# Patient Record
Sex: Female | Born: 1965 | Race: Black or African American | Hispanic: No | Marital: Single | Smoking: Never smoker
Health system: Southern US, Community
[De-identification: ages and names within clinical notes are randomized; demographics above are authoritative.]

## PROBLEM LIST (undated history)

## (undated) DIAGNOSIS — I639 Cerebral infarction, unspecified: Secondary | ICD-10-CM

## (undated) DIAGNOSIS — T4145XA Adverse effect of unspecified anesthetic, initial encounter: Secondary | ICD-10-CM

## (undated) DIAGNOSIS — Z789 Other specified health status: Secondary | ICD-10-CM

## (undated) DIAGNOSIS — Z9861 Coronary angioplasty status: Secondary | ICD-10-CM

## (undated) DIAGNOSIS — E669 Obesity, unspecified: Secondary | ICD-10-CM

## (undated) DIAGNOSIS — D219 Benign neoplasm of connective and other soft tissue, unspecified: Secondary | ICD-10-CM

## (undated) DIAGNOSIS — T8859XA Other complications of anesthesia, initial encounter: Secondary | ICD-10-CM

## (undated) DIAGNOSIS — I10 Essential (primary) hypertension: Secondary | ICD-10-CM

## (undated) DIAGNOSIS — J45909 Unspecified asthma, uncomplicated: Secondary | ICD-10-CM

## (undated) DIAGNOSIS — E119 Type 2 diabetes mellitus without complications: Secondary | ICD-10-CM

## (undated) DIAGNOSIS — H9190 Unspecified hearing loss, unspecified ear: Secondary | ICD-10-CM

## (undated) DIAGNOSIS — E785 Hyperlipidemia, unspecified: Secondary | ICD-10-CM

## (undated) DIAGNOSIS — I2510 Atherosclerotic heart disease of native coronary artery without angina pectoris: Secondary | ICD-10-CM

## (undated) HISTORY — PX: CARDIAC CATHETERIZATION: SHX172

## (undated) HISTORY — PX: HERNIA REPAIR: SHX51

## (undated) HISTORY — DX: Benign neoplasm of connective and other soft tissue, unspecified: D21.9

## (undated) HISTORY — PX: DILATION AND CURETTAGE OF UTERUS: SHX78

---

## 2013-10-05 ENCOUNTER — Emergency Department (HOSPITAL_COMMUNITY): Payer: Medicare (Managed Care)

## 2013-10-05 ENCOUNTER — Emergency Department (HOSPITAL_COMMUNITY)
Admission: EM | Admit: 2013-10-05 | Discharge: 2013-10-05 | Disposition: A | Discharge status: Home / Self Care | Payer: Medicare (Managed Care) | Attending: Emergency Medicine | Admitting: Emergency Medicine

## 2013-10-05 ENCOUNTER — Encounter (HOSPITAL_COMMUNITY): Payer: Self-pay | Admitting: Emergency Medicine

## 2013-10-05 DIAGNOSIS — R05 Cough: Secondary | ICD-10-CM

## 2013-10-05 DIAGNOSIS — Z91040 Latex allergy status: Secondary | ICD-10-CM | POA: Insufficient documentation

## 2013-10-05 DIAGNOSIS — E119 Type 2 diabetes mellitus without complications: Secondary | ICD-10-CM | POA: Insufficient documentation

## 2013-10-05 DIAGNOSIS — J45901 Unspecified asthma with (acute) exacerbation: Secondary | ICD-10-CM | POA: Insufficient documentation

## 2013-10-05 HISTORY — DX: Obesity, unspecified: E66.9

## 2013-10-05 HISTORY — DX: Type 2 diabetes mellitus without complications: E11.9

## 2013-10-05 HISTORY — DX: Unspecified asthma, uncomplicated: J45.909

## 2013-10-05 MED ORDER — ALBUTEROL SULFATE (5 MG/ML) 0.5% IN NEBU
5.0000 mg | INHALATION_SOLUTION | Freq: Once | RESPIRATORY_TRACT | Status: AC
  Administered 2013-10-05: 5 mg via RESPIRATORY_TRACT
  Filled 2013-10-05: qty 1

## 2013-10-05 MED ORDER — GI COCKTAIL ~~LOC~~
30.0000 mL | Freq: Once | ORAL | Status: AC
  Administered 2013-10-05: 30 mL via ORAL
  Filled 2013-10-05: qty 30

## 2013-10-05 MED ORDER — IPRATROPIUM BROMIDE 0.02 % IN SOLN
0.5000 mg | Freq: Once | RESPIRATORY_TRACT | Status: AC
  Administered 2013-10-05: 0.5 mg via RESPIRATORY_TRACT
  Filled 2013-10-05: qty 2.5

## 2013-10-05 NOTE — ED Notes (Signed)
PT ambulated with baseline gait; VSS; A&Ox3; no signs of distress; respirations even and unlabored; skin warm and dry; no questions upon discharge.  

## 2013-10-05 NOTE — ED Notes (Signed)
Sign language interpreter paged

## 2013-10-05 NOTE — ED Notes (Signed)
Pt not in waiting room when called; nurse first to look for pt when she returns.

## 2013-10-05 NOTE — ED Notes (Signed)
Pt has hx of reflux; reports she spit up, threw up sticky mucous last night.

## 2013-10-05 NOTE — ED Notes (Signed)
Pt reports she feels "alot better; coughed a lot of it out". PA aware.

## 2013-10-05 NOTE — ED Provider Notes (Signed)
CSN: [id number]     Arrival date & time 10/05/13  1547 History  This chart was scribed for non-physician practitioner, Sharilyn Sites, PA-C working with Carmen Moore. Oletta Lamas, MD by Greggory Stallion, ED scribe. This patient was seen in room TR11C/TR11C and the patient's care was started at 5:41 PM.   Chief Complaint  Patient presents with  . Asthma   The history is provided by the patient. A language interpreter was used (Sign Language Interpreter - Eliseo Squires of [employer]).   HPI Comments: Carmen Moore is a 47 y.o. female with history of of asthma who presents to the Emergency Department complaining of and intermittent productive cough and mild shortness of breath onset 3 days ago. Cough is productive of clear sputum. Patient has hx of asthma states that she has used her inhalers with no relief of symptoms. Patient further reports that she had a couple of episodes of emesis of clear sputum last night that is attributed to acid reflux. She denies chest pain, palpitations, or any other symptoms at this time. Her other medical history includes DM.   Past Medical History  Diagnosis Date  . Asthma   . Obesity   . Diabetes mellitus without complication    History reviewed. No pertinent past surgical history. History reviewed. No pertinent family history. History  Substance Use Topics  . Smoking status: Never Smoker   . Smokeless tobacco: Not on file  . Alcohol Use: No   OB History   Grav Para Term Preterm Abortions TAB SAB Ect Mult Living                 Review of Systems  Respiratory: Positive for cough and shortness of breath.   Cardiovascular: Negative for chest pain.  All other systems reviewed and are negative.    Allergies  Latex  Home Medications  No current outpatient prescriptions on file.  BP 170/110  Pulse 94  Temp(Src) 98.9 F (37.2 C) (Oral)  Resp 18  SpO2 95%  Physical Exam  Nursing note and vitals  reviewed. Constitutional: She is oriented to person, place, and time. She appears well-developed and well-nourished. No distress.  Morbidly obese.   HENT:  Head: Normocephalic and atraumatic.  Mouth/Throat: Uvula is midline, oropharynx is clear and moist and mucous membranes are normal. No trismus in the jaw. No oropharyngeal exudate, posterior oropharyngeal edema, posterior oropharyngeal erythema or tonsillar abscesses.  Tonsils normal in appearance bilaterally without exudate, uvula midline, no peritonsillar abscess, healing secretions properly, no difficulty swallowing  Eyes: Conjunctivae and EOM are normal. Pupils are equal, round, and reactive to light.  Neck: Normal range of motion. Neck supple.  Cardiovascular: Normal rate, regular rhythm and normal heart sounds.   Pulmonary/Chest: Effort normal and breath sounds normal. No respiratory distress. She has no wheezes. She has no rhonchi. She has no rales.  Lungs CTAB  Musculoskeletal: Normal range of motion.  Neurological: She is alert and oriented to person, place, and time.  Skin: Skin is warm and dry. She is not diaphoretic.  Psychiatric: She has a normal mood and affect.    ED Course  Procedures (including critical care time)  DIAGNOSTIC STUDIES: Oxygen Saturation is 95% on RA, adequate by my interpretation.    COORDINATION OF CARE: 5:43 PM- CXR negative for bronchitis or pneumonia. Will give nebulizer treatments and GI cocktail. Discussed treatment plan with pt at bedside and pt agreed to plan.  6:15 PM- Patient is improved after  breathing treatment and is ready to go home.  Labs Review Labs Reviewed - No data to display  Imaging Review Dg Chest 2 View (if Patient Has Fever And/or Copd)  10/05/2013   CLINICAL DATA:  Shortness of breath, cough, and vomiting.  EXAM: CHEST  2 VIEW  COMPARISON:  None.  FINDINGS: The heart size and mediastinal contours are within normal limits. Both lungs are clear. The visualized skeletal  structures are unremarkable.  IMPRESSION: Normal exam.   Electronically Signed   By: Geanie Cooley M.D.   On: 10/05/2013 17:15    EKG Interpretation   None       MDM   1. Asthma exacerbation, mild   2. Cough    CXR clear.  Pt given albuterol Atrovent nebulizer treatment with good improvement of symptoms. Lungs remain clear to auscultation. She will [facility]up [facility] if problems occur. Discussed plan with patient via, and which interpreter, she now is understanding and agreed. Return precautions advised.  I personally performed the services described in this documentation, which was scribed in my presence. The recorded information has been reviewed and is accurate.  Carmen Hatchet, PA-C 10/05/13 Ernestina Carmen

## 2013-10-05 NOTE — ED Notes (Signed)
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 Pt does read lips, reports having asthma, no relief with inhalers and having productive cough. Airway is intact at triage.

## 2013-10-07 NOTE — ED Provider Notes (Signed)
Medical screening examination/treatment/procedure(s) were performed by non-physician practitioner and as supervising physician I was immediately available for consultation/collaboration.  Delshon Blanchfield Y. Akira Adelsberger, MD 10/07/13 1643 

## 2013-10-10 ENCOUNTER — Inpatient Hospital Stay (HOSPITAL_COMMUNITY)
Admission: EM | Admit: 2013-10-10 | Discharge: 2013-10-11 | DRG: 287 | Disposition: A | Discharge status: Home / Self Care | Payer: Medicare (Managed Care) | Attending: Internal Medicine | Admitting: Internal Medicine

## 2013-10-10 ENCOUNTER — Emergency Department (HOSPITAL_COMMUNITY): Payer: Medicare (Managed Care)

## 2013-10-10 ENCOUNTER — Encounter (HOSPITAL_COMMUNITY): Payer: Self-pay | Admitting: Emergency Medicine

## 2013-10-10 DIAGNOSIS — R0789 Other chest pain: Principal | ICD-10-CM | POA: Diagnosis present

## 2013-10-10 DIAGNOSIS — J45909 Unspecified asthma, uncomplicated: Secondary | ICD-10-CM | POA: Diagnosis present

## 2013-10-10 DIAGNOSIS — Z79899 Other long term (current) drug therapy: Secondary | ICD-10-CM

## 2013-10-10 DIAGNOSIS — I359 Nonrheumatic aortic valve disorder, unspecified: Secondary | ICD-10-CM

## 2013-10-10 DIAGNOSIS — I2510 Atherosclerotic heart disease of native coronary artery without angina pectoris: Secondary | ICD-10-CM | POA: Diagnosis present

## 2013-10-10 DIAGNOSIS — E785 Hyperlipidemia, unspecified: Secondary | ICD-10-CM | POA: Diagnosis present

## 2013-10-10 DIAGNOSIS — Z7982 Long term (current) use of aspirin: Secondary | ICD-10-CM

## 2013-10-10 DIAGNOSIS — Z9861 Coronary angioplasty status: Secondary | ICD-10-CM

## 2013-10-10 DIAGNOSIS — I10 Essential (primary) hypertension: Secondary | ICD-10-CM | POA: Diagnosis present

## 2013-10-10 DIAGNOSIS — Z6841 Body Mass Index (BMI) 40.0 and over, adult: Secondary | ICD-10-CM

## 2013-10-10 DIAGNOSIS — R079 Chest pain, unspecified: Secondary | ICD-10-CM | POA: Diagnosis present

## 2013-10-10 DIAGNOSIS — Z23 Encounter for immunization: Secondary | ICD-10-CM

## 2013-10-10 DIAGNOSIS — E669 Obesity, unspecified: Secondary | ICD-10-CM | POA: Diagnosis present

## 2013-10-10 DIAGNOSIS — E119 Type 2 diabetes mellitus without complications: Secondary | ICD-10-CM | POA: Diagnosis present

## 2013-10-10 DIAGNOSIS — H913 Deaf nonspeaking, not elsewhere classified: Secondary | ICD-10-CM | POA: Diagnosis present

## 2013-10-10 HISTORY — DX: Hyperlipidemia, unspecified: E78.5

## 2013-10-10 HISTORY — DX: Essential (primary) hypertension: I10

## 2013-10-10 HISTORY — DX: Unspecified hearing loss, unspecified ear: H91.90

## 2013-10-10 HISTORY — DX: Atherosclerotic heart disease of native coronary artery without angina pectoris: Z98.61

## 2013-10-10 HISTORY — DX: Coronary angioplasty status: Z98.61

## 2013-10-10 HISTORY — DX: Atherosclerotic heart disease of native coronary artery without angina pectoris: I25.10

## 2013-10-10 LAB — URINE MICROSCOPIC-ADD ON

## 2013-10-10 LAB — PROTIME-INR: Prothrombin Time: 11.9 seconds (ref 11.6–15.2)

## 2013-10-10 LAB — CBC
HCT: 37.9 % (ref 36.0–46.0)
HCT: 37.4 % (ref 36.0–46.0)
MCH: 26.6 pg (ref 26.0–34.0)
MCHC: 32.2 g/dL (ref 30.0–36.0)
MCHC: 31.8 g/dL (ref 30.0–36.0)
MCV: 82.8 fL (ref 78.0–100.0)
MCV: 82.7 fL (ref 78.0–100.0)
Platelets: 236 10*3/uL (ref 150–400)
RBC: 4.58 MIL/uL (ref 3.87–5.11)
RDW: 14.8 % (ref 11.5–15.5)
RDW: 14.5 % (ref 11.5–15.5)

## 2013-10-10 LAB — URINALYSIS, ROUTINE W REFLEX MICROSCOPIC
Bilirubin Urine: NEGATIVE
Glucose, UA: NEGATIVE mg/dL
Hgb urine dipstick: NEGATIVE
Ketones, ur: NEGATIVE mg/dL
Leukocytes, UA: NEGATIVE
Nitrite: NEGATIVE
Protein, ur: 30 mg/dL — AB
Specific Gravity, Urine: 1.024 (ref 1.005–1.030)
Urobilinogen, UA: 0.2 mg/dL (ref 0.0–1.0)
pH: 7 (ref 5.0–8.0)

## 2013-10-10 LAB — MRSA PCR SCREENING: MRSA by PCR: NEGATIVE

## 2013-10-10 LAB — COMPREHENSIVE METABOLIC PANEL
AST: 16 U/L (ref 0–37)
Albumin: 3.6 g/dL (ref 3.5–5.2)
BUN: 9 mg/dL (ref 6–23)
Creatinine, Ser: 0.67 mg/dL (ref 0.50–1.10)
Sodium: 139 mEq/L (ref 135–145)
Total Bilirubin: 0.3 mg/dL (ref 0.3–1.2)
Total Protein: 7.7 g/dL (ref 6.0–8.3)

## 2013-10-10 LAB — GLUCOSE, CAPILLARY
Glucose-Capillary: 82 mg/dL (ref 70–99)
Glucose-Capillary: 109 mg/dL — ABNORMAL HIGH (ref 70–99)

## 2013-10-10 LAB — TROPONIN I
Troponin I: <0.3 ng/mL (ref <0.30)
Troponin I: <0.3 ng/mL (ref <0.30)
Troponin I: <0.3 ng/mL (ref <0.30)

## 2013-10-10 LAB — PRO B NATRIURETIC PEPTIDE: Pro B Natriuretic peptide (BNP): 44.7 pg/mL (ref 0–125)

## 2013-10-10 LAB — MAGNESIUM: Magnesium: 2 mg/dL (ref 1.5–2.5)

## 2013-10-10 MED ORDER — MORPHINE SULFATE 4 MG/ML IJ SOLN
4.0000 mg | Freq: Once | INTRAMUSCULAR | Status: AC
  Administered 2013-10-10: 4 mg via INTRAVENOUS
  Filled 2013-10-10: qty 1

## 2013-10-10 MED ORDER — SODIUM CHLORIDE 0.9 % IV SOLN
INTRAVENOUS | Status: DC
  Administered 2013-10-11: 06:00:00 via INTRAVENOUS

## 2013-10-10 MED ORDER — ASPIRIN 81 MG PO CHEW: 324.0000 mg | CHEWABLE_TABLET | Freq: Once | ORAL | Status: DC

## 2013-10-10 MED ORDER — MORPHINE SULFATE 2 MG/ML IJ SOLN
2.0000 mg | Freq: Once | INTRAMUSCULAR | Status: AC
  Administered 2013-10-10: 2 mg via INTRAVENOUS
  Filled 2013-10-10: qty 1

## 2013-10-10 MED ORDER — MAGNESIUM CITRATE PO SOLN: 1.0000 | Freq: Once | ORAL | Status: AC | PRN

## 2013-10-10 MED ORDER — HEPARIN BOLUS VIA INFUSION
4000.0000 [IU] | Freq: Once | INTRAVENOUS | Status: AC
  Administered 2013-10-10: 4000 [IU] via INTRAVENOUS
  Filled 2013-10-10: qty 4000

## 2013-10-10 MED ORDER — SODIUM CHLORIDE 0.9 % IJ SOLN: 3.0000 mL | Freq: Two times a day (BID) | INTRAMUSCULAR | Status: DC

## 2013-10-10 MED ORDER — ALBUTEROL SULFATE HFA 108 (90 BASE) MCG/ACT IN AERS
1.0000 | INHALATION_SPRAY | Freq: Four times a day (QID) | RESPIRATORY_TRACT | Status: DC | PRN
  Filled 2013-10-10: qty 6.7

## 2013-10-10 MED ORDER — ACETAMINOPHEN 325 MG PO TABS: 650.0000 mg | ORAL_TABLET | ORAL | Status: DC | PRN

## 2013-10-10 MED ORDER — ASPIRIN 81 MG PO CHEW: 81.0000 mg | CHEWABLE_TABLET | Freq: Every day | ORAL | Status: DC

## 2013-10-10 MED ORDER — DIAZEPAM 2 MG PO TABS
2.0000 mg | ORAL_TABLET | ORAL | Status: AC
  Administered 2013-10-11: 2 mg via ORAL
  Filled 2013-10-10: qty 1

## 2013-10-10 MED ORDER — INSULIN ASPART 100 UNIT/ML ~~LOC~~ SOLN: 0.0000 [IU] | Freq: Three times a day (TID) | SUBCUTANEOUS | Status: DC

## 2013-10-10 MED ORDER — ACETAMINOPHEN 325 MG PO TABS: 650.0000 mg | ORAL_TABLET | Freq: Four times a day (QID) | ORAL | Status: DC | PRN

## 2013-10-10 MED ORDER — MORPHINE SULFATE 2 MG/ML IJ SOLN
2.0000 mg | INTRAMUSCULAR | Status: DC | PRN
  Administered 2013-10-10 (×2): 2 mg via INTRAVENOUS
  Filled 2013-10-10 (×2): qty 1

## 2013-10-10 MED ORDER — IPRATROPIUM BROMIDE 0.02 % IN SOLN: 0.5000 mg | RESPIRATORY_TRACT | Status: DC | PRN

## 2013-10-10 MED ORDER — SODIUM CHLORIDE 0.9 % IJ SOLN: 3.0000 mL | INTRAMUSCULAR | Status: DC | PRN

## 2013-10-10 MED ORDER — HEPARIN SODIUM (PORCINE) 5000 UNIT/ML IJ SOLN: 5000.0000 [IU] | Freq: Three times a day (TID) | INTRAMUSCULAR | Status: DC

## 2013-10-10 MED ORDER — NITROGLYCERIN 0.4 MG SL SUBL
0.4000 mg | SUBLINGUAL_TABLET | SUBLINGUAL | Status: DC | PRN
  Filled 2013-10-10: qty 25

## 2013-10-10 MED ORDER — ASPIRIN EC 325 MG PO TBEC
325.0000 mg | DELAYED_RELEASE_TABLET | Freq: Every day | ORAL | Status: DC
  Administered 2013-10-11: 325 mg via ORAL
  Filled 2013-10-10 (×2): qty 1

## 2013-10-10 MED ORDER — SODIUM CHLORIDE 0.9 % IV SOLN
INTRAVENOUS | Status: DC
  Administered 2013-10-10: 10:00:00 via INTRAVENOUS

## 2013-10-10 MED ORDER — MORPHINE SULFATE 4 MG/ML IJ SOLN
6.0000 mg | Freq: Once | INTRAMUSCULAR | Status: AC
  Administered 2013-10-10: 6 mg via INTRAVENOUS
  Filled 2013-10-10: qty 2

## 2013-10-10 MED ORDER — POTASSIUM CHLORIDE CRYS ER 20 MEQ PO TBCR
20.0000 meq | EXTENDED_RELEASE_TABLET | Freq: Every day | ORAL | Status: DC
  Administered 2013-10-11: 20 meq via ORAL
  Filled 2013-10-10: qty 1

## 2013-10-10 MED ORDER — DIAZEPAM 2 MG PO TABS: 2.0000 mg | ORAL_TABLET | ORAL | Status: DC

## 2013-10-10 MED ORDER — ACETAMINOPHEN 650 MG RE SUPP: 650.0000 mg | Freq: Four times a day (QID) | RECTAL | Status: DC | PRN

## 2013-10-10 MED ORDER — HEPARIN (PORCINE) IN NACL 100-0.45 UNIT/ML-% IJ SOLN
2100.0000 [IU]/h | INTRAMUSCULAR | Status: DC
  Administered 2013-10-10: 1600 [IU]/h via INTRAVENOUS
  Administered 2013-10-11 (×2): 2100 [IU]/h via INTRAVENOUS
  Filled 2013-10-10 (×5): qty 250

## 2013-10-10 MED ORDER — METOPROLOL TARTRATE 50 MG PO TABS
50.0000 mg | ORAL_TABLET | Freq: Every day | ORAL | Status: DC
  Administered 2013-10-10 – 2013-10-11 (×2): 50 mg via ORAL
  Filled 2013-10-10 (×2): qty 1

## 2013-10-10 MED ORDER — ONDANSETRON HCL 4 MG/2ML IJ SOLN
4.0000 mg | Freq: Once | INTRAMUSCULAR | Status: AC
  Administered 2013-10-10: 4 mg via INTRAVENOUS
  Filled 2013-10-10: qty 2

## 2013-10-10 MED ORDER — SIMVASTATIN 40 MG PO TABS
40.0000 mg | ORAL_TABLET | Freq: Every day | ORAL | Status: DC
  Administered 2013-10-10: 40 mg via ORAL
  Filled 2013-10-10 (×2): qty 1

## 2013-10-10 MED ORDER — ONDANSETRON HCL 4 MG PO TABS: 4.0000 mg | ORAL_TABLET | Freq: Four times a day (QID) | ORAL | Status: DC | PRN

## 2013-10-10 MED ORDER — SORBITOL 70 % SOLN
30.0000 mL | Freq: Every day | Status: DC | PRN
  Filled 2013-10-10: qty 30

## 2013-10-10 MED ORDER — FUROSEMIDE 40 MG PO TABS
40.0000 mg | ORAL_TABLET | Freq: Every day | ORAL | Status: DC
  Administered 2013-10-10: 40 mg via ORAL
  Filled 2013-10-10 (×2): qty 1

## 2013-10-10 MED ORDER — HEPARIN SODIUM (PORCINE) 5000 UNIT/ML IJ SOLN
5000.0000 [IU] | Freq: Three times a day (TID) | INTRAMUSCULAR | Status: DC
  Filled 2013-10-10 (×3): qty 1

## 2013-10-10 MED ORDER — ALBUTEROL SULFATE (5 MG/ML) 0.5% IN NEBU: 2.5000 mg | INHALATION_SOLUTION | RESPIRATORY_TRACT | Status: DC | PRN

## 2013-10-10 MED ORDER — ONDANSETRON HCL 4 MG/2ML IJ SOLN
4.0000 mg | Freq: Four times a day (QID) | INTRAMUSCULAR | Status: DC | PRN
  Administered 2013-10-10: 4 mg via INTRAVENOUS
  Filled 2013-10-10: qty 2

## 2013-10-10 MED ORDER — ONDANSETRON HCL 4 MG/2ML IJ SOLN: 4.0000 mg | Freq: Four times a day (QID) | INTRAMUSCULAR | Status: DC | PRN

## 2013-10-10 MED ORDER — LISINOPRIL 10 MG PO TABS
10.0000 mg | ORAL_TABLET | Freq: Every day | ORAL | Status: DC
  Administered 2013-10-10: 10 mg via ORAL
  Filled 2013-10-10 (×2): qty 1

## 2013-10-10 MED ORDER — OXYCODONE HCL 5 MG PO TABS
5.0000 mg | ORAL_TABLET | ORAL | Status: DC | PRN
  Administered 2013-10-10: 5 mg via ORAL
  Filled 2013-10-10: qty 1

## 2013-10-10 MED ORDER — SODIUM CHLORIDE 0.9 % IV SOLN: 250.0000 mL | INTRAVENOUS | Status: DC | PRN

## 2013-10-10 MED ORDER — ASPIRIN 81 MG PO CHEW
81.0000 mg | CHEWABLE_TABLET | ORAL | Status: AC
  Administered 2013-10-11: 81 mg via ORAL
  Filled 2013-10-10: qty 1

## 2013-10-10 MED ORDER — ALUM & MAG HYDROXIDE-SIMETH 200-200-20 MG/5ML PO SUSP: 30.0000 mL | Freq: Four times a day (QID) | ORAL | Status: DC | PRN

## 2013-10-10 NOTE — H&P (Signed)
[facility] History and Physical  Miamor Ayler JYN:[id number] DOB: 1966-06-06 DOA: 10/10/2013  Referring physician: Charlestine Night, PA PCP: No PCP Per Patient   Chief Complaint:   HPI: Scherry Laverne is a 47 y.o. female  Who was visiting from [place], with history of hypertension, hyperlipidemia, asthma, diabetes, probable coronary artery disease about 8 years ago as patient describes a cardiac catheterization with cleaning of vessels who presents to the ED with sudden onset midsternal to left substernal chest pain radiating to the left upper extremity described as a squeezing sensation with left upper extremity numbness. Patient stated that pain had been constant and currently at 10 out of a 10. Patient endorses some shortness of breath, palpitations, nausea. Patient denies any diaphoresis, no vomiting, no fever, no chills, no orthopnea, no paroxysmal nocturnal dyspnea. Patient states she's lost some weight. Patient also endorses some lower extremity edema over a week. Patient also with a cough of whitish sputum over the past 2 weeks. Patient was brought to the ED by EMS and was given some aspirin. Patient states she is intolerant to nitroglycerin. In the ED comprehensive metabolic profile alk phosphatase of 121 a glucose of 142 otherwise was within normal limits. Pro BNP was 44.7. CBC was unremarkable.EKG had T-wave inversions in leads 2,3, aVF, V3 through V6. We were called to admit the patient for further evaluation and management.   Review of Systems:  Constitutional:  No weight loss, night sweats, Fevers, chills, fatigue.  HEENT:  No headaches, Difficulty swallowing,Tooth/dental problems,Sore throat,  No sneezing, itching, ear ache, nasal congestion, post nasal drip,  Cardio-vascular:  No chest pain, Orthopnea, PND, swelling in lower extremities, anasarca, dizziness, palpitations  GI:  No heartburn, indigestion, abdominal pain, nausea, vomiting, diarrhea, change in bowel habits,  loss of appetite  Resp:  No shortness of breath with exertion or at rest. No excess mucus, no productive cough, No non-productive cough, No coughing up of blood.No change in color of mucus.No wheezing.No chest wall deformity  Skin:  no rash or lesions.  GU:  no dysuria, change in color of urine, no urgency or frequency. No flank pain.  Musculoskeletal:  No joint pain or swelling. No decreased range of motion. No back pain.  Psych:  No change in mood or affect. No depression or anxiety. No memory loss.   Past Medical History  Diagnosis Date  . Asthma   . Obesity   . Diabetes mellitus without complication   . Hypertension   . HTN (hypertension) 10/10/2013  . CAD S/P percutaneous coronary angioplasty: PROBABLE PER PT DESCRIPTION OF CATH 2006 10/10/2013  . Hyperlipidemia    Past Surgical History  Procedure Laterality Date  . Hernia repair     Social History:  reports that she has never smoked. She has never used smokeless tobacco. She reports that she does not drink alcohol or use illicit drugs.  Allergies  Allergen Reactions  . Nitroglycerin Itching  . Latex Rash    History reviewed. No pertinent family history.   Prior to Admission medications   Medication Sig Start Date End Date Taking? Authorizing Provider  albuterol (PROVENTIL HFA;VENTOLIN HFA) 108 (90 BASE) MCG/ACT inhaler Inhale 1-2 puffs into the lungs every 6 (six) hours as needed for wheezing or shortness of breath.   Yes Historical Provider, MD  aspirin 81 MG tablet Take 81 mg by mouth daily.   Yes Historical Provider, MD  furosemide (LASIX) 40 MG tablet Take 40 mg by mouth daily.   Yes Historical Provider, MD  lisinopril (PRINIVIL,ZESTRIL) 10 MG tablet Take 10 mg by mouth daily.   Yes Historical Provider, MD  metoprolol (LOPRESSOR) 50 MG tablet Take 50 mg by mouth daily.   Yes Historical Provider, MD  potassium chloride SA (K-DUR,KLOR-CON) 20 MEQ tablet Take 20 mEq by mouth daily.   Yes Historical Provider, MD   simvastatin (ZOCOR) 40 MG tablet Take 40 mg by mouth daily.   Yes Historical Provider, MD   Physical Exam: Filed Vitals:   10/10/13 1300  BP: 106/52  Pulse: 80  Temp:   Resp: 9    BP 106/52  Pulse 80  Temp(Src) 98.3 F (36.8 C) (Oral)  Resp 9  SpO2 96%  General:  Laying in bed with c/o CP. Eyes: PERRL, normal lids, irises & conjunctiva ENT: grossly normal hearing, lips & tongue Neck: no LAD, masses or thyromegaly Cardiovascular: RRR, no m/r/g. No LE edema. Telemetry: SR, no arrhythmias  Respiratory: CTA bilaterally, no w/r/r. Normal respiratory effort. Abdomen: soft, ntnd, positive bowel sounds. Skin: no rash or induration seen on limited exam Musculoskeletal: grossly normal tone BUE/BLE Psychiatric: grossly normal mood and affect, speech fluent and appropriate Neurologic: Alert and oriented x 3. CN 2-12 grossly intact. No focal deficits.          Labs on Admission:  Basic Metabolic Panel:  Recent Labs Lab 10/10/13 0920  NA 139  K 3.9  CL 100  CO2 26  GLUCOSE 142*  BUN 9  CREATININE 0.67  CALCIUM 9.3   Liver Function Tests:  Recent Labs Lab 10/10/13 0920  AST 16  ALT 12  ALKPHOS 121*  BILITOT 0.3  PROT 7.7  ALBUMIN 3.6   No results found for this basename: LIPASE, AMYLASE,  in the last 168 hours No results found for this basename: AMMONIA,  in the last 168 hours CBC:  Recent Labs Lab 10/10/13 0920  WBC 10.3  HGB 12.2  HCT 37.9  MCV 82.8  PLT 236   Cardiac Enzymes:  Recent Labs Lab 10/10/13 0920  TROPONINI <0.30    BNP (last 3 results)  Recent Labs  10/10/13 0920  PROBNP 44.7   CBG: No results found for this basename: GLUCAP,  in the last 168 hours  Radiological Exams on Admission: Dg Chest 2 View  10/10/2013   CLINICAL DATA:  Chest pain  EXAM: CHEST  2 VIEW  COMPARISON:  October 05, 2013  FINDINGS: There is no edema or consolidation. Heart is enlarged with normal pulmonary vascularity. No adenopathy. There is  degenerative change in the thoracic spine.  IMPRESSION: Cardiomegaly.  No edema or consolidation.   Electronically Signed   By: Bretta Bang M.D.   On: 10/10/2013 09:01    EKG: Independently reviewed. T-wave inversion in leads 2,3, aVF, V3 through V6.  Assessment/Plan Principal Problem:   Chest pain Active Problems:   HTN (hypertension)   Hyperlipidemia   Diabetes mellitus   Asthma   CAD S/P percutaneous coronary angioplasty: PROBABLE PER PT DESCRIPTION OF CATH 2006  #1 chest pain Patient presented with typical symptoms of chest pain with left upper extremity radiation. Patient with hypertension, diabetes, hyperlipidemia, probable prior coronary artery disease per patient's description of her cardiac catheterization. Will admit the patient to step down unit as patient is currently having ongoing chest pain. Patient is intolerant to nitroglycerin. Thank you for lipid panel. Will cycle cardiac enzymes every 6 hours x3. Check a 2-D echo. Will place on oxygen, morphine,continue home regimen of her lisinopril, Lopressor, Lasix, simvastatin, aspirin. Consult  with cardiology for further evaluation and management.  #2 hypertension Resume home regimen of lisinopril, Lopressor, Lasix.  #3. Hyperlipidemia Check FLP. Continue statin  #4 DM Check Hgb A1C. SSI.  #5. Asthma Nebs as needed.  #6. Prophylaxis Lovenox for DVT prophylaxis.  Code Status: full Family Communication: updated patient at bedside no family present. Disposition Plan: Admit to step down unit  Time spent: 45 MINS  [facility] MD [facility] Pager [phone]

## 2013-10-10 NOTE — Progress Notes (Signed)
  Echocardiogram 2D Echocardiogram has been performed.  Carmen Moore [name] 10/10/2013, 6:49 PM

## 2013-10-10 NOTE — ED Notes (Signed)
Pt presents via [facility] with chest pain that start this morning at 0700am.  The patient is deaf and unable to provide details until interpreter at bedside.  Per EMS, the chest pain was substernal with left arm pain.  Pt took 324mg  at home prior to EMS arrival.  Pt denied N/V/SOB.

## 2013-10-10 NOTE — ED Notes (Signed)
Vernona Rieger PA with Cardiology at bedside.

## 2013-10-10 NOTE — Progress Notes (Signed)
ANTICOAGULATION CONSULT NOTE - Initial Consult  Pharmacy Consult for Heparin Indication: chest pain/ACS  Allergies  Allergen Reactions  . Tomato   . Beef-Derived Products Diarrhea  . Nitroglycerin Itching  . Latex Rash    Patient Measurements: Height: 5\' 8"  (172.7 cm) Weight: 329 lb (149.233 kg) IBW/kg (Calculated) : 63.9 Heparin Dosing Weight: 103 kg  Vital Signs: Temp: 98.7 F (37.1 C) (12/23 1600) Temp src: Oral (12/23 1600) BP: 119/84 mmHg (12/23 1600) Pulse Rate: 87 (12/23 1500)  Labs:  Recent Labs  10/10/13 0920 10/10/13 1505 10/10/13 1717  HGB 12.2  --  11.9*  HCT 37.9  --  37.4  PLT 236  --  226  APTT  --   --  30  LABPROT  --   --  11.9  INR  --   --  0.89  CREATININE 0.67  --   --   TROPONINI <0.30 <0.30  --     Estimated Creatinine Clearance: 135.9 ml/min (by C-G formula based on Cr of 0.67).   Medical History: Past Medical History  Diagnosis Date  . Asthma   . Obesity   . Diabetes mellitus without complication   . Hypertension   . HTN (hypertension) 10/10/2013  . CAD S/P percutaneous coronary angioplasty: PROBABLE PER PT DESCRIPTION OF CATH 2006 10/10/2013  . Hyperlipidemia   . Deaf       Assessment: 47yof admitted with CP.  1st Tp negative, CBC stable, renal function stable, BP 120/80, HR 80s.   Goal of Therapy:  Heparin level 0.3-0.7 units/ml Monitor platelets by anticoagulation protocol: Yes   Plan:  Heparin bolus 4000 uts IV x1 Heparin drip 1600 uts/hr Heparin level 6hr after drip started Daily CBC, HL  Leota Sauers Pharm.D. CPP, BCPS Clinical Pharmacist [phone] 10/10/2013 6:10 PM

## 2013-10-10 NOTE — ED Provider Notes (Signed)
CSN: [id number]     Arrival date & time 10/10/13  4098 History   First MD Initiated Contact with Patient 10/10/13 [phone]     Chief Complaint  Patient presents with  . Chest Pain   (Consider location/radiation/quality/duration/timing/severity/associated sxs/prior Treatment) HPI Patient presents emergency department with chest pain, that started acutely at 7 AM the patient, states the pain radiated to her left arm.  Patient has an extensive cardiac history, along with diabetes, and hypertension.  Patient is here from [place].  She is also a deaf mute patient states, that she did not take any medications prior to arrival for her symptoms.  This was given nitroglycerin by EMS, along with aspirin.  Patient denies fever, cough, nausea, vomiting, diarrhea, abdominal pain, dizziness, blurred vision, headache, back pain, or syncope.  The patient, states, that she was hospitalized 2 months ago, similar symptoms in [place].  This history is obtained through the interpreter Past Medical History  Diagnosis Date  . Asthma   . Obesity   . Diabetes mellitus without complication   . Hypertension    Past Surgical History  Procedure Laterality Date  . Hernia repair     No family history on file. History  Substance Use Topics  . Smoking status: Never Smoker   . Smokeless tobacco: Never Used  . Alcohol Use: No   OB History   Grav Para Term Preterm Abortions TAB SAB Ect Mult Living                 Review of Systems All other systems negative except as documented in the HPI. All pertinent positives and negatives as reviewed in the HPI. Allergies  Latex  Home Medications   Current Outpatient Rx  Name  Route  Sig  Dispense  Refill  . albuterol (PROVENTIL HFA;VENTOLIN HFA) 108 (90 BASE) MCG/ACT inhaler   Inhalation   Inhale 1-2 puffs into the lungs every 6 (six) hours as needed for wheezing or shortness of breath.         Marland Kitchen aspirin 81 MG tablet   Oral   Take 81 mg by mouth daily.         .  furosemide (LASIX) 40 MG tablet   Oral   Take 40 mg by mouth daily.         Marland Kitchen lisinopril (PRINIVIL,ZESTRIL) 10 MG tablet   Oral   Take 10 mg by mouth daily.         . metoprolol (LOPRESSOR) 50 MG tablet   Oral   Take 50 mg by mouth daily.         . potassium chloride SA (K-DUR,KLOR-CON) 20 MEQ tablet   Oral   Take 20 mEq by mouth daily.         . simvastatin (ZOCOR) 40 MG tablet   Oral   Take 40 mg by mouth daily.          BP 150/77  Pulse 70  Temp(Src) 98.3 F (36.8 C) (Oral)  Resp 19  SpO2 100% Physical Exam  Nursing note and vitals reviewed. Constitutional: She is oriented to person, place, and time. She appears well-developed and well-nourished. No distress.  HENT:  Head: Normocephalic and atraumatic.  Mouth/Throat: Oropharynx is clear and moist.  Eyes: Pupils are equal, round, and reactive to light.  Neck: Normal range of motion. Neck supple.  Cardiovascular: Normal rate, regular rhythm and normal heart sounds.  Exam reveals no gallop and no friction rub.   No murmur heard. Pulmonary/Chest: Effort  normal and breath sounds normal. No respiratory distress.  Abdominal: Soft. Bowel sounds are normal. She exhibits no distension. There is no tenderness.  Neurological: She is alert and oriented to person, place, and time. She exhibits normal muscle tone. Coordination normal.  Skin: Skin is warm and dry. No rash noted. No erythema.    ED Course  Procedures (including critical care time) Labs Review Labs Reviewed  COMPREHENSIVE METABOLIC PANEL - Abnormal; Notable for the following:    Glucose, Bld 142 (*)    Alkaline Phosphatase 121 (*)    All other components within normal limits  URINALYSIS, ROUTINE W REFLEX MICROSCOPIC - Abnormal; Notable for the following:    Protein, ur 30 (*)    All other components within normal limits  URINE MICROSCOPIC-ADD ON - Abnormal; Notable for the following:    Squamous Epithelial / LPF FEW (*)    All other components  within normal limits  CBC  TROPONIN I  PRO B NATRIURETIC PEPTIDE   Imaging Review Dg Chest 2 View  10/10/2013   CLINICAL DATA:  Chest pain  EXAM: CHEST  2 VIEW  COMPARISON:  October 05, 2013  FINDINGS: There is no edema or consolidation. Heart is enlarged with normal pulmonary vascularity. No adenopathy. There is degenerative change in the thoracic spine.  IMPRESSION: Cardiomegaly.  No edema or consolidation.   Electronically Signed   By: Bretta Bang M.D.   On: 10/10/2013 09:01    EKG Interpretation    Date/Time:  Tuesday October 10 2013 08:32:34 EST Ventricular Rate:  79 PR Interval:  147 QRS Duration: 78 QT Interval:  383 QTC Calculation: 439 R Axis:   51 Text Interpretation:  Sinus rhythm Abnormal T, consider ischemia, diffuse leads No previous tracing Confirmed by Denton Lank  MD, KEVIN ([id number]) on 10/10/2013 8:39:09 AM            Patient be admitted to the hospital for further evaluation.  She did have T-wave abnormalities noted without old EKG in our system, spoke with cardiology and [facility].  Patient is stable at this time  Carlyle Dolly, PA-C 10/10/13 1532

## 2013-10-10 NOTE — Consult Note (Signed)
Reason for Consult: chest pain   Referring Physician: Dr. Janee Morn  No PCP Per Patient Primary Cardiologist:NEW  Carmen Moore is an 47 y.o. female.    Chief Complaint: chest pain    HPI: 47 year old female with hx of asthma now presents with chest pain and an abnormal EKG.  The chest pain woke her from sleep around 5 AM, Midsternal chest pressure, no associated nausea or diaphoresis, +SOB,  Continues with 10/10 chest pressure.  Has had morphine with mild improvement.  Pain is present with movement and very tender under Lt breast to palpation.    Cardiac enzymes negative so far.  EKG with ST depression in lat leads, possible LVH.    She has had similar pain this year and had a stress test 3 months ago that was ok.  She had cardiac cath 8 years ago that was OK.  (I communicated with an person who does sign language.)  She does have diabetes, HTN and Hyper cholesterol all treated.  She had been on Lasix but has not taken in a few months.   Past Medical History  Diagnosis Date  . Asthma   . Obesity   . Diabetes mellitus without complication   . Hypertension   . HTN (hypertension) 10/10/2013  . CAD S/P percutaneous coronary angioplasty: PROBABLE PER PT DESCRIPTION OF CATH 2006 10/10/2013  . Hyperlipidemia   . Deaf     Past Surgical History  Procedure Laterality Date  . Hernia repair    . Cardiac catheterization      Family History  Problem Relation Age of Onset  . Hypertension Mother    Social History:  reports that she has never smoked. She has never used smokeless tobacco. She reports that she does not drink alcohol or use illicit drugs.  Allergies:  Allergies  Allergen Reactions  . Nitroglycerin Itching  . Latex Rash    Outpatient Meds: Lisinopril 10 mg daily Metoprolol tartrate 50 mg every am Simvastatin 40 mg daily Asa 81 mg daily K dur 20 meq daily Stopped lasix 40 mg several days ago   Results for orders placed during the hospital encounter of  10/10/13 (from the past 48 hour(s))  CBC     Status: None   Collection Time    10/10/13  9:20 AM      Result Value Range   WBC 10.3  4.0 - 10.5 K/uL   RBC 4.58  3.87 - 5.11 MIL/uL   Hemoglobin 12.2  12.0 - 15.0 g/dL   HCT 16.1  09.6 - 04.5 %   MCV 82.8  78.0 - 100.0 fL   MCH 26.6  26.0 - 34.0 pg   MCHC 32.2  30.0 - 36.0 g/dL   RDW 40.9  81.1 - 91.4 %   Platelets 236  150 - 400 K/uL  COMPREHENSIVE METABOLIC PANEL     Status: Abnormal   Collection Time    10/10/13  9:20 AM      Result Value Range   Sodium 139  135 - 145 mEq/L   Potassium 3.9  3.5 - 5.1 mEq/L   Chloride 100  96 - 112 mEq/L   CO2 26  19 - 32 mEq/L   Glucose, Bld 142 (*) 70 - 99 mg/dL   BUN 9  6 - 23 mg/dL   Creatinine, Ser 7.82  0.50 - 1.10 mg/dL   Calcium 9.3  8.4 - 95.6 mg/dL   Total Protein 7.7  6.0 - 8.3 g/dL   Albumin 3.6  3.5 - 5.2 g/dL   AST 16  0 - 37 U/L   ALT 12  0 - 35 U/L   Alkaline Phosphatase 121 (*) 39 - 117 U/L   Total Bilirubin 0.3  0.3 - 1.2 mg/dL   GFR calc non Af Amer >90  >90 mL/min   GFR calc Af Amer >90  >90 mL/min   Comment: (NOTE)     The eGFR has been calculated using the CKD EPI equation.     This calculation has not been validated in all clinical situations.     eGFR's persistently <90 mL/min signify possible Chronic Kidney     Disease.  TROPONIN I     Status: None   Collection Time    10/10/13  9:20 AM      Result Value Range   Troponin I <0.30  <0.30 ng/mL   Comment:            Due to the release kinetics of cTnI,     a negative result within the first hours     of the onset of symptoms does not rule out     myocardial infarction with certainty.     If myocardial infarction is still suspected,     repeat the test at appropriate intervals.  PRO B NATRIURETIC PEPTIDE     Status: None   Collection Time    10/10/13  9:20 AM      Result Value Range   Pro B Natriuretic peptide (BNP) 44.7  0 - 125 pg/mL  URINALYSIS, ROUTINE W REFLEX MICROSCOPIC     Status: Abnormal    Collection Time    10/10/13 10:22 AM      Result Value Range   Color, Urine YELLOW  YELLOW   APPearance CLEAR  CLEAR   Specific Gravity, Urine 1.024  1.005 - 1.030   pH 7.0  5.0 - 8.0   Glucose, UA NEGATIVE  NEGATIVE mg/dL   Hgb urine dipstick NEGATIVE  NEGATIVE   Bilirubin Urine NEGATIVE  NEGATIVE   Ketones, ur NEGATIVE  NEGATIVE mg/dL   Protein, ur 30 (*) NEGATIVE mg/dL   Urobilinogen, UA 0.2  0.0 - 1.0 mg/dL   Nitrite NEGATIVE  NEGATIVE   Leukocytes, UA NEGATIVE  NEGATIVE  URINE MICROSCOPIC-ADD ON     Status: Abnormal   Collection Time    10/10/13 10:22 AM      Result Value Range   Squamous Epithelial / LPF FEW (*) RARE   WBC, UA 0-2  <3 WBC/hpf   Urine-Other MUCOUS PRESENT     Dg Chest 2 View  10/10/2013   CLINICAL DATA:  Chest pain  EXAM: CHEST  2 VIEW  COMPARISON:  October 05, 2013  FINDINGS: There is no edema or consolidation. Heart is enlarged with normal pulmonary vascularity. No adenopathy. There is degenerative change in the thoracic spine.  IMPRESSION: Cardiomegaly.  No edema or consolidation.   Electronically Signed   By: Bretta Bang M.D.   On: 10/10/2013 09:01    ROS: General:no colds or fevers, no weight changes Skin:no rashes or ulcers HEENT:no blurred vision, no congestion CV:see HPI PUL:see HPI GI:no diarrhea constipation or melena, no indigestion GU:no hematuria, no dysuria MS:no joint pain, no claudication Neuro:no syncope, no lightheadedness Endo:+ diabetes, no thyroid disease   Blood pressure 147/87, pulse 87, temperature 98.3 F (36.8 C), temperature source Oral, resp. rate 20, SpO2 98.00%. PE: General:Pleasant affect, NAD, sign language  interpreter with her. Skin:Warm and dry, brisk capillary refill HEENT:normocephalic, sclera clear, mucus membranes moist, deaf Neck:supple, no JVD, no bruits  Heart:S1S2 RRR without murmur, gallup, rub or click, + tenderness to chest wall also Lungs:clear though diminished without rales, rhonchi, or  wheezes ZOX:WRUEA, soft, non tender, + BS, do not palpate liver spleen or masses, + tenderness LUQ to palpation Ext:no lower ext edema, 2+ pedal pulses, 2+ radial pulses Neuro:alert and oriented, MAE, follows commands, + facial symmetry    Assessment/Plan Principal Problem:   Chest pain Active Problems:   HTN (hypertension)   Hyperlipidemia   Diabetes mellitus   Asthma   CAD S/P percutaneous coronary angioplasty: PROBABLE PER PT DESCRIPTION OF CATH 2006  PLAN: Pt with chest pain and LUQ pain, without nausea.  + tenderness.  EKG changes could be LVH.  Troponin is negative.  PRO BNP negative. Pt refused NTG cause significant drop in BP. Agree with admit.  Follow enzymes, old records from [facility].-pt visiting the area. She has these episodes every 3 months or so. Last nuc 3 months ago.  MD to see and evaluate.   [facility] R  Nurse Practitioner Certified [facility] Pager [phone] or after 5pm or weekends call [phone] 10/10/2013, 3:28 PM     Agree with note written by Nada Boozer RNP  Atypical CP. Neg cath remotely and neg stress test in [place] 3 months ago. Pt is Deaf and a Scientist, product/process development. EKG shows Late TWI ? secondary to LVH, can't R/O ischemia. Getting old records.  Exam benign. Only good way to resolve pain issue in this pt is cath which I will do radially tomorrow. Discussed with pt through interpreter.  Runell Gess 10/10/2013 4:48 PM

## 2013-10-10 NOTE — Progress Notes (Signed)
Interpreter set up for 0830 12/24 at the request of Dr Allyson Sabal for the heart cath.  Left a message at [phone] ext [phone].  They promptly called back to confirm.    Eliane Decree, RN

## 2013-10-11 ENCOUNTER — Encounter (HOSPITAL_COMMUNITY)
Admission: EM | Disposition: A | Discharge status: Home / Self Care | Payer: Self-pay | Source: Home / Self Care | Attending: Internal Medicine

## 2013-10-11 DIAGNOSIS — J45909 Unspecified asthma, uncomplicated: Secondary | ICD-10-CM

## 2013-10-11 HISTORY — PX: LEFT HEART CATHETERIZATION WITH CORONARY ANGIOGRAM: SHX5451

## 2013-10-11 LAB — CBC
Hemoglobin: 11.7 g/dL — ABNORMAL LOW (ref 12.0–15.0)
MCH: 26.1 pg (ref 26.0–34.0)
MCHC: 31.2 g/dL (ref 30.0–36.0)
RBC: 4.49 MIL/uL (ref 3.87–5.11)
RDW: 14.9 % (ref 11.5–15.5)

## 2013-10-11 LAB — URINALYSIS, ROUTINE W REFLEX MICROSCOPIC
Glucose, UA: NEGATIVE mg/dL
Hgb urine dipstick: NEGATIVE
Leukocytes, UA: NEGATIVE
Protein, ur: NEGATIVE mg/dL
Specific Gravity, Urine: 1.013 (ref 1.005–1.030)
pH: 6 (ref 5.0–8.0)

## 2013-10-11 LAB — GLUCOSE, CAPILLARY: Glucose-Capillary: 118 mg/dL — ABNORMAL HIGH (ref 70–99)

## 2013-10-11 LAB — LIPID PANEL
HDL: 49 mg/dL (ref >39)
LDL Cholesterol: 76 mg/dL (ref 0–99)
Triglycerides: 220 mg/dL — ABNORMAL HIGH (ref <150)
VLDL: 44 mg/dL — ABNORMAL HIGH (ref 0–40)

## 2013-10-11 LAB — PREGNANCY, URINE: Preg Test, Ur: NEGATIVE

## 2013-10-11 LAB — HEPARIN LEVEL (UNFRACTIONATED): Heparin Unfractionated: 0.93 IU/mL — ABNORMAL HIGH (ref 0.30–0.70)

## 2013-10-11 LAB — BASIC METABOLIC PANEL
BUN: 10 mg/dL (ref 6–23)
Creatinine, Ser: 0.71 mg/dL (ref 0.50–1.10)
GFR calc Af Amer: >90 mL/min (ref >90)
GFR calc non Af Amer: >90 mL/min (ref >90)
Glucose, Bld: 160 mg/dL — ABNORMAL HIGH (ref 70–99)
Potassium: 4 mEq/L (ref 3.5–5.1)
Sodium: 138 mEq/L (ref 135–145)

## 2013-10-11 LAB — TROPONIN I: Troponin I: <0.3 ng/mL (ref <0.30)

## 2013-10-11 LAB — HEMOGLOBIN A1C: Hgb A1c MFr Bld: 7.8 % — ABNORMAL HIGH (ref <5.7)

## 2013-10-11 LAB — PROTIME-INR
INR: 0.96 (ref 0.00–1.49)
Prothrombin Time: 12.6 seconds (ref 11.6–15.2)

## 2013-10-11 SURGERY — LEFT HEART CATHETERIZATION WITH CORONARY ANGIOGRAM
Anesthesia: LOCAL

## 2013-10-11 MED ORDER — SODIUM CHLORIDE 0.9 % IV SOLN: INTRAVENOUS | Status: AC

## 2013-10-11 MED ORDER — HEPARIN (PORCINE) IN NACL 2-0.9 UNIT/ML-% IJ SOLN
INTRAMUSCULAR | Status: AC
  Filled 2013-10-11: qty 1000

## 2013-10-11 MED ORDER — PNEUMOCOCCAL VAC POLYVALENT 25 MCG/0.5ML IJ INJ: 0.5000 mL | INJECTION | INTRAMUSCULAR | Status: DC

## 2013-10-11 MED ORDER — LIDOCAINE HCL (PF) 1 % IJ SOLN
INTRAMUSCULAR | Status: AC
  Filled 2013-10-11: qty 30

## 2013-10-11 MED ORDER — VERAPAMIL HCL 2.5 MG/ML IV SOLN
INTRAVENOUS | Status: AC
  Filled 2013-10-11: qty 2

## 2013-10-11 MED ORDER — INFLUENZA VAC SPLIT QUAD 0.5 ML IM SUSP: 0.5000 mL | INTRAMUSCULAR | Status: DC

## 2013-10-11 MED ORDER — NITROGLYCERIN 0.2 MG/ML ON CALL CATH LAB
INTRAVENOUS | Status: AC
  Filled 2013-10-11: qty 1

## 2013-10-11 MED ORDER — FENTANYL CITRATE 0.05 MG/ML IJ SOLN
INTRAMUSCULAR | Status: AC
  Filled 2013-10-11: qty 2

## 2013-10-11 MED ORDER — MIDAZOLAM HCL 2 MG/2ML IJ SOLN
INTRAMUSCULAR | Status: AC
  Filled 2013-10-11: qty 2

## 2013-10-11 MED ORDER — HEPARIN SODIUM (PORCINE) 1000 UNIT/ML IJ SOLN
INTRAMUSCULAR | Status: AC
  Filled 2013-10-11: qty 1

## 2013-10-11 NOTE — Progress Notes (Signed)
ANTICOAGULATION CONSULT NOTE - Follow Up Consult  Pharmacy Consult for heparin Indication: chest pain/ACS  Labs:  Recent Labs  10/10/13 0920 10/10/13 1505 10/10/13 1717 10/10/13 2110 10/11/13 0015 10/11/13 0158 10/11/13 0226  HGB 12.2  --  11.9*  --   --   --  11.7*  HCT 37.9  --  37.4  --   --   --  37.5  PLT 236  --  226  --   --   --  238  APTT  --   --  30  --   --   --   --   LABPROT  --   --  11.9  --   --   --  12.6  INR  --   --  0.89  --   --   --  0.96  HEPARINUNFRC  --   --   --   --  0.33  --  0.27*  CREATININE 0.67  --  0.68  --   --   --  0.71  TROPONINI <0.30 <0.30  --  <0.30  --  <0.30  --     Assessment: 47yo female now subtherapeutic on heparin after first level at low end of goal.  Goal of Therapy:  Heparin level 0.3-0.7 units/ml   Plan:  Will increase heparin gtt by 2 units/kg/hr to 2100 units/hr and check level in 6hr.  Vernard Gambles, PharmD, BCPS  10/11/2013,4:55 AM

## 2013-10-11 NOTE — Progress Notes (Signed)
[facility] PROGRESS NOTE  Carmen Moore HYQ:[id number] DOB: 07/26/1966 DOA: 10/10/2013 PCP: No PCP Per Patient  Assessment/Plan: 47 y.o. female Who was visiting from [place], with history of hypertension, hyperlipidemia, asthma, diabetes, probable coronary artery disease about 8 years ago as patient describes a cardiac catheterization with cleaning of vessels who presents to the ED with sudden onset midsternal to left substernal chest pain radiating to the left upper extremity described as a squeezing sensation with left upper extremity numbness  1. Chest pain, trop neg; ecg: t wave inversion;  -CP resolved, on IV hep, BB, ASA, statin, ACE (hold due to cath); awaiting LHC per cardiology;   2. Hypertension; Resume home regimen of lisinopril (hold), Lopressor, Lasix.   3. Hyperlipidemia Continue statin   4. DM Hgb A1C-7.8. unclear history; Cont SSI for now   5. Asthma Nebs as needed.   Code Status: full Family Communication: d/w patient (indicate person spoken with, relationship, and if by phone, the number) Disposition Plan: per [facility]   Consultants:  cardiology  Procedures:  Pend lhc   Antibiotics:  None  (indicate start date, and stop date if known)  HPI/Subjective: alert  Objective: Filed Vitals:   10/11/13 0738  BP:   Pulse:   Temp: 97.7 F (36.5 C)  Resp:     Intake/Output Summary (Last 24 hours) at 10/11/13 0858 Last data filed at 10/11/13 0500  Gross per 24 hour  Intake    739 ml  Output   1600 ml  Net   -861 ml   Filed Weights   10/10/13 1700  Weight: 149.233 kg (329 lb)    Exam:   General:  alert  Cardiovascular: s1,s2 rrr  Respiratory: CTA BL  Abdomen: soft, nt, nd   Musculoskeletal: no LE edema   Data Reviewed: Basic Metabolic Panel:  Recent Labs Lab 10/10/13 0920 10/10/13 1717 10/11/13 0226  NA 139  --  138  K 3.9  --  4.0  CL 100  --  101  CO2 26  --  26  GLUCOSE 142*  --  160*  BUN 9  --  10  CREATININE 0.67 0.68  0.71  CALCIUM 9.3  --  8.8  MG  --  2.0  --    Liver Function Tests:  Recent Labs Lab 10/10/13 0920  AST 16  ALT 12  ALKPHOS 121*  BILITOT 0.3  PROT 7.7  ALBUMIN 3.6   No results found for this basename: LIPASE, AMYLASE,  in the last 168 hours No results found for this basename: AMMONIA,  in the last 168 hours CBC:  Recent Labs Lab 10/10/13 0920 10/10/13 1717 10/11/13 0226  WBC 10.3 10.5 12.3*  HGB 12.2 11.9* 11.7*  HCT 37.9 37.4 37.5  MCV 82.8 82.7 83.5  PLT 236 226 238   Cardiac Enzymes:  Recent Labs Lab 10/10/13 0920 10/10/13 1505 10/10/13 2110 10/11/13 0158  TROPONINI <0.30 <0.30 <0.30 <0.30   BNP (last 3 results)  Recent Labs  10/10/13 0920  PROBNP 44.7   CBG:  Recent Labs Lab 10/10/13 1653 10/10/13 2154 10/11/13 0743  GLUCAP 82 109* 118*    Recent Results (from the past 240 hour(s))  MRSA PCR SCREENING     Status: None   Collection Time    10/10/13  6:15 PM      Result Value Range Status   MRSA by PCR NEGATIVE  NEGATIVE Final   Comment:            The GeneXpert MRSA  Assay (FDA     approved for NASAL specimens     only), is one component of a     comprehensive MRSA colonization     surveillance program. It is not     intended to diagnose MRSA     infection nor to guide or     monitor treatment for     MRSA infections.     Studies: Dg Chest 2 View  10/10/2013   CLINICAL DATA:  Chest pain  EXAM: CHEST  2 VIEW  COMPARISON:  October 05, 2013  FINDINGS: There is no edema or consolidation. Heart is enlarged with normal pulmonary vascularity. No adenopathy. There is degenerative change in the thoracic spine.  IMPRESSION: Cardiomegaly.  No edema or consolidation.   Electronically Signed   By: Bretta Bang M.D.   On: 10/10/2013 09:01    Scheduled Meds: . aspirin  324 mg Oral Once  . aspirin EC  325 mg Oral Daily  . diazepam  2 mg Oral On Call  . furosemide  40 mg Oral Daily  . [START ON 10/12/2013] influenza vac split  quadrivalent PF  0.5 mL Intramuscular Tomorrow-1000  . insulin aspart  0-15 Units Subcutaneous TID WC  . lisinopril  10 mg Oral Daily  . metoprolol  50 mg Oral Daily  . [START ON 10/12/2013] pneumococcal 23 valent vaccine  0.5 mL Intramuscular Tomorrow-1000  . potassium chloride SA  20 mEq Oral Daily  . simvastatin  40 mg Oral q1800  . sodium chloride  3 mL Intravenous Q12H   Continuous Infusions: . sodium chloride 20 mL/hr at 10/10/13 0951  . sodium chloride 75 mL/hr at 10/11/13 0559  . heparin 2,100 Units/hr (10/11/13 1610)    Principal Problem:   Chest pain Active Problems:   HTN (hypertension)   Hyperlipidemia   Diabetes mellitus   Asthma   CAD S/P percutaneous coronary angioplasty: PROBABLE PER PT DESCRIPTION OF CATH 2006    Time spent: >35 minutes     Esperanza Sheets  [facility] Pager [phone]. If 7PM-7AM, please contact night-coverage at [email or web address], password [facility] 10/11/2013, 8:58 AM  LOS: 1 day

## 2013-10-11 NOTE — CV Procedure (Signed)
Carmen Moore is a 47 y.o. female    [id number] LOCATION:  FACILITY: [facility]  PHYSICIAN: Nanetta Batty, M.D. 10/22/1965   DATE OF PROCEDURE:  10/11/2013  DATE OF DISCHARGE:     CARDIAC CATHETERIZATION     History obtained from chart review.47 year old female with hx of asthma now presents with chest pain and an abnormal EKG. The chest pain woke her from sleep around 5 AM, Midsternal chest pressure, no associated nausea or diaphoresis, +SOB, Continues with 10/10 chest pressure. Has had morphine with mild improvement. Pain is present with movement and very tender under Lt breast to palpation.  Cardiac enzymes negative so far. EKG with ST depression in lat leads, possible LVH.  She has had similar pain this year and had a stress test 3 months ago that was ok. She had cardiac cath 8 years ago that was OK. (I communicated with an person who does sign language.) She does have diabetes, HTN and Hyper cholesterol all treated. She had been on Lasix but has not taken in a few months.    PROCEDURE DESCRIPTION:   The patient was brought to the second floor [facility] cath lab in the postabsorptive state. She was premedicated with Valium 5 mg by mouth, IV Versed and fentanyl. Her right wristwas prepped and shaved in usual sterile fashion. Xylocaine 1% was used for local anesthesia. A 5 French sheath was inserted into the right radial artery using standard Seldinger technique. The patient received 6000 units  of heparin  intravenously.  A 5 French IJ catheter and pigtail catheters were used for selective coronary angiography and left ventriculography respectively. Visipaque dye was used for the entirety of the case.  Retrograded work, left ventricular and pullback pressures were recorded.   HEMODYNAMICS:    AO SYSTOLIC/AO DIASTOLIC: 167/101   LV SYSTOLIC/LV DIASTOLIC: 170/18  ANGIOGRAPHIC RESULTS:   1. Left main; normal  2. LAD; normal 3. Left circumflex; nondominant and normal.  4.  Right coronary artery; dominant and normal 5. Left ventriculography; RAO left ventriculogram was performed using  25 mL of Visipaque dye at 12 mL/second. The overall LVEF estimated  65 %  Without wall motion abnormalities  IMPRESSION:[name] has a completely normal coronaries and normal left ventricular function. I believe her chest pain is noncardiac. I am going to check a D. Dimer rule out pulmonary embolus. If this is normal, she'll be discharged home on antiarrhythmic therapy and will be seen back on a when necessary basis.  Runell Gess MD, [facility] 10/11/2013 10:18 AM

## 2013-10-11 NOTE — Progress Notes (Signed)
PT Cancellation Note  Patient Details Name: Carmen Moore MRN: [id number] DOB: 1965/12/18   Cancelled Treatment:    Reason Eval/Treat Not Completed: Patient at procedure or test/unavailable (pt in cath lab and unavailable)   Toney Sang [facility] 10/11/2013, 9:30 AM Delaney Meigs, PT [phone]

## 2013-10-11 NOTE — H&P (View-Only) (Signed)
       Reason for Consult: chest pain   Referring Physician: Dr. Thompson  No PCP Per Patient Primary Cardiologist:NEW  Carmen Moore is an 47 y.o. female.    Chief Complaint: chest pain    HPI: 47 year old female with hx of asthma now presents with chest pain and an abnormal EKG.  The chest pain woke her from sleep around 5 AM, Midsternal chest pressure, no associated nausea or diaphoresis, +SOB,  Continues with 10/10 chest pressure.  Has had morphine with mild improvement.  Pain is present with movement and very tender under Lt breast to palpation.    Cardiac enzymes negative so far.  EKG with ST depression in lat leads, possible LVH.    She has had similar pain this year and had a stress test 3 months ago that was ok.  She had cardiac cath 8 years ago that was OK.  (I communicated with an person who does sign language.)  She does have diabetes, HTN and Hyper cholesterol all treated.  She had been on Lasix but has not taken in a few months.   Past Medical History  Diagnosis Date  . Asthma   . Obesity   . Diabetes mellitus without complication   . Hypertension   . HTN (hypertension) 10/10/2013  . CAD S/P percutaneous coronary angioplasty: PROBABLE PER PT DESCRIPTION OF CATH 2006 10/10/2013  . Hyperlipidemia   . Deaf     Past Surgical History  Procedure Laterality Date  . Hernia repair    . Cardiac catheterization      Family History  Problem Relation Age of Onset  . Hypertension Mother    Social History:  reports that she has never smoked. She has never used smokeless tobacco. She reports that she does not drink alcohol or use illicit drugs.  Allergies:  Allergies  Allergen Reactions  . Nitroglycerin Itching  . Latex Rash    Outpatient Meds: Lisinopril 10 mg daily Metoprolol tartrate 50 mg every am Simvastatin 40 mg daily Asa 81 mg daily K dur 20 meq daily Stopped lasix 40 mg several days ago   Results for orders placed during the hospital encounter of  10/10/13 (from the past 48 hour(s))  CBC     Status: None   Collection Time    10/10/13  9:20 AM      Result Value Range   WBC 10.3  4.0 - 10.5 K/uL   RBC 4.58  3.87 - 5.11 MIL/uL   Hemoglobin 12.2  12.0 - 15.0 g/dL   HCT 37.9  36.0 - 46.0 %   MCV 82.8  78.0 - 100.0 fL   MCH 26.6  26.0 - 34.0 pg   MCHC 32.2  30.0 - 36.0 g/dL   RDW 14.8  11.5 - 15.5 %   Platelets 236  150 - 400 K/uL  COMPREHENSIVE METABOLIC PANEL     Status: Abnormal   Collection Time    10/10/13  9:20 AM      Result Value Range   Sodium 139  135 - 145 mEq/L   Potassium 3.9  3.5 - 5.1 mEq/L   Chloride 100  96 - 112 mEq/L   CO2 26  19 - 32 mEq/L   Glucose, Bld 142 (*) 70 - 99 mg/dL   BUN 9  6 - 23 mg/dL   Creatinine, Ser 0.67  0.50 - 1.10 mg/dL   Calcium 9.3  8.4 - 10.5 mg/dL   Total Protein 7.7    6.0 - 8.3 g/dL   Albumin 3.6  3.5 - 5.2 g/dL   AST 16  0 - 37 U/L   ALT 12  0 - 35 U/L   Alkaline Phosphatase 121 (*) 39 - 117 U/L   Total Bilirubin 0.3  0.3 - 1.2 mg/dL   GFR calc non Af Amer >90  >90 mL/min   GFR calc Af Amer >90  >90 mL/min   Comment: (NOTE)     The eGFR has been calculated using the CKD EPI equation.     This calculation has not been validated in all clinical situations.     eGFR's persistently <90 mL/min signify possible Chronic Kidney     Disease.  TROPONIN I     Status: None   Collection Time    10/10/13  9:20 AM      Result Value Range   Troponin I <0.30  <0.30 ng/mL   Comment:            Due to the release kinetics of cTnI,     a negative result within the first hours     of the onset of symptoms does not rule out     myocardial infarction with certainty.     If myocardial infarction is still suspected,     repeat the test at appropriate intervals.  PRO B NATRIURETIC PEPTIDE     Status: None   Collection Time    10/10/13  9:20 AM      Result Value Range   Pro B Natriuretic peptide (BNP) 44.7  0 - 125 pg/mL  URINALYSIS, ROUTINE W REFLEX MICROSCOPIC     Status: Abnormal    Collection Time    10/10/13 10:22 AM      Result Value Range   Color, Urine YELLOW  YELLOW   APPearance CLEAR  CLEAR   Specific Gravity, Urine 1.024  1.005 - 1.030   pH 7.0  5.0 - 8.0   Glucose, UA NEGATIVE  NEGATIVE mg/dL   Hgb urine dipstick NEGATIVE  NEGATIVE   Bilirubin Urine NEGATIVE  NEGATIVE   Ketones, ur NEGATIVE  NEGATIVE mg/dL   Protein, ur 30 (*) NEGATIVE mg/dL   Urobilinogen, UA 0.2  0.0 - 1.0 mg/dL   Nitrite NEGATIVE  NEGATIVE   Leukocytes, UA NEGATIVE  NEGATIVE  URINE MICROSCOPIC-ADD ON     Status: Abnormal   Collection Time    10/10/13 10:22 AM      Result Value Range   Squamous Epithelial / LPF FEW (*) RARE   WBC, UA 0-2  <3 WBC/hpf   Urine-Other MUCOUS PRESENT     Dg Chest 2 View  10/10/2013   CLINICAL DATA:  Chest pain  EXAM: CHEST  2 VIEW  COMPARISON:  October 05, 2013  FINDINGS: There is no edema or consolidation. Heart is enlarged with normal pulmonary vascularity. No adenopathy. There is degenerative change in the thoracic spine.  IMPRESSION: Cardiomegaly.  No edema or consolidation.   Electronically Signed   By: William  Woodruff M.D.   On: 10/10/2013 09:01    ROS: General:no colds or fevers, no weight changes Skin:no rashes or ulcers HEENT:no blurred vision, no congestion CV:see HPI PUL:see HPI GI:no diarrhea constipation or melena, no indigestion GU:no hematuria, no dysuria MS:no joint pain, no claudication Neuro:no syncope, no lightheadedness Endo:+ diabetes, no thyroid disease   Blood pressure 147/87, pulse 87, temperature 98.3 F (36.8 C), temperature source Oral, resp. rate 20, SpO2 98.00%. PE: General:Pleasant affect, NAD, sign language   interpreter with her. Skin:Warm and dry, brisk capillary refill HEENT:normocephalic, sclera clear, mucus membranes moist, deaf Neck:supple, no JVD, no bruits  Heart:S1S2 RRR without murmur, gallup, rub or click, + tenderness to chest wall also Lungs:clear though diminished without rales, rhonchi, or  wheezes Abd:obese, soft, non tender, + BS, do not palpate liver spleen or masses, + tenderness LUQ to palpation Ext:no lower ext edema, 2+ pedal pulses, 2+ radial pulses Neuro:alert and oriented, MAE, follows commands, + facial symmetry    Assessment/Plan Principal Problem:   Chest pain Active Problems:   HTN (hypertension)   Hyperlipidemia   Diabetes mellitus   Asthma   CAD S/P percutaneous coronary angioplasty: PROBABLE PER PT DESCRIPTION OF CATH 2006  PLAN: Pt with chest pain and LUQ pain, without nausea.  + tenderness.  EKG changes could be LVH.  Troponin is negative.  PRO BNP negative. Pt refused NTG cause significant drop in BP. Agree with admit.  Follow enzymes, old records from [facility].-pt visiting the area. She has these episodes every 3 months or so. Last nuc 3 months ago.  MD to see and evaluate.   INGOLD,LAURA R  Nurse Practitioner Certified [facility] Pager [phone] or after 5pm or weekends call [phone] 10/10/2013, 3:28 PM     Agree with note written by Laura Ingold RNP  Atypical CP. Neg cath remotely and neg stress test in [place] 3 months ago. Pt is Deaf and a Jehovah's Witness. EKG shows Late TWI ? secondary to LVH, can't R/O ischemia. Getting old records.  Exam benign. Only good way to resolve pain issue in this pt is cath which I will do radially tomorrow. Discussed with pt through interpreter.  Carmen Moore J 10/10/2013 4:48 PM   

## 2013-10-11 NOTE — Progress Notes (Signed)
Subjective: Interpreter was present during encounter. The patient denies any further chest pain. Mild SOB. Using [place]. With mild dysuria. Denies fever and chills.   Objective: Vital signs in last 24 hours: Temp:  [97.7 F (36.5 C)-98.7 F (37.1 C)] 97.7 F (36.5 C) (12/24 0738) Pulse Rate:  [56-87] 67 (12/24 0400) Resp:  [0-30] 13 (12/24 0400) BP: (96-177)/(52-103) 133/78 mmHg (12/24 0400) SpO2:  [88 %-100 %] 96 % (12/24 0400) Weight:  [329 lb (149.233 kg)] 329 lb (149.233 kg) (12/23 1700) Last BM Date: 10/09/13  Intake/Output from previous day: 12/23 0701 - 12/24 0700 In: 739 [P.O.:240; I.V.:499] Out: 1600 [Urine:1600] Intake/Output this shift:    Medications Current Facility-Administered Medications  Medication Dose Route Frequency Provider Last Rate Last Dose  . 0.9 %  sodium chloride infusion   Intravenous Continuous Suzi Roots, MD 20 mL/hr at 10/10/13 [phone]    . 0.9 %  sodium chloride infusion   Intravenous Continuous Nada Boozer, NP 75 mL/hr at 10/11/13 0559    . acetaminophen (TYLENOL) tablet 650 mg  650 mg Oral Q6H PRN Rodolph Bong, MD       Or  . acetaminophen (TYLENOL) suppository 650 mg  650 mg Rectal Q6H PRN Rodolph Bong, MD      . albuterol (PROVENTIL HFA;VENTOLIN HFA) 108 (90 BASE) MCG/ACT inhaler 1-2 puff  1-2 puff Inhalation Q6H PRN Rodolph Bong, MD      . albuterol (PROVENTIL) (5 MG/ML) 0.5% nebulizer solution 2.5 mg  2.5 mg Nebulization Q2H PRN Rodolph Bong, MD      . alum & mag hydroxide-simeth (MAALOX/MYLANTA) 200-200-20 MG/5ML suspension 30 mL  30 mL Oral Q6H PRN Rodolph Bong, MD      . aspirin chewable tablet 324 mg  324 mg Oral Once Suzi Roots, MD      . aspirin EC tablet 325 mg  325 mg Oral Daily Rodolph Bong, MD      . diazepam (VALIUM) tablet 2 mg  2 mg Oral On Call Runell Gess, MD      . furosemide (LASIX) tablet 40 mg  40 mg Oral Daily Rodolph Bong, MD   40 mg at 10/10/13 1854  . heparin ADULT  infusion 100 units/mL (25000 units/250 mL)  2,100 Units/hr Intravenous Continuous Colleen Can, [facility] 21 mL/hr at 10/11/13 0605 2,100 Units/hr at 10/11/13 1191  . [START ON 10/12/2013] influenza vac split quadrivalent PF (FLUARIX) injection 0.5 mL  0.5 mL Intramuscular Tomorrow-1000 Marykay Lex, MD      . insulin aspart (novoLOG) injection 0-15 Units  0-15 Units Subcutaneous TID WC Rodolph Bong, MD      . ipratropium (ATROVENT) nebulizer solution 0.5 mg  0.5 mg Nebulization Q2H PRN Rodolph Bong, MD      . lisinopril (PRINIVIL,ZESTRIL) tablet 10 mg  10 mg Oral Daily Rodolph Bong, MD   10 mg at 10/10/13 1854  . metoprolol (LOPRESSOR) tablet 50 mg  50 mg Oral Daily Rodolph Bong, MD   50 mg at 10/10/13 1854  . morphine 2 MG/ML injection 2 mg  2 mg Intravenous Q4H PRN Rodolph Bong, MD   2 mg at 10/10/13 2145  . ondansetron (ZOFRAN) tablet 4 mg  4 mg Oral Q6H PRN Rodolph Bong, MD       Or  . ondansetron [facility]) injection 4 mg  4 mg Intravenous Q6H PRN Rodolph Bong, MD   4 mg  at 10/10/13 1901  . oxyCODONE (Oxy IR/ROXICODONE) immediate release tablet 5 mg  5 mg Oral Q4H PRN Rodolph Bong, MD   5 mg at 10/10/13 1855  . [START ON 10/12/2013] pneumococcal 23 valent vaccine (PNU-IMMUNE) injection 0.5 mL  0.5 mL Intramuscular Tomorrow-1000 Marykay Lex, MD      . potassium chloride SA (K-DUR,KLOR-CON) CR tablet 20 mEq  20 mEq Oral Daily Rodolph Bong, MD      . simvastatin (ZOCOR) tablet 40 mg  40 mg Oral q1800 Rodolph Bong, MD   40 mg at 10/10/13 1854  . sodium chloride 0.9 % injection 3 mL  3 mL Intravenous Q12H Rodolph Bong, MD      . sorbitol 70 % solution 30 mL  30 mL Oral Daily PRN Rodolph Bong, MD        PE: General appearance: alert, cooperative and no distress Lungs: clear to auscultation bilaterally Heart: regular rate and rhythm, S1, S2 normal, no murmur, click, rub or gallop Extremities: no LEE Pulses: 2+ and  symmetric Skin: warm and dry Neurologic: Grossly normal  Lab Results:   Recent Labs  10/10/13 0920 10/10/13 1717 10/11/13 0226  WBC 10.3 10.5 12.3*  HGB 12.2 11.9* 11.7*  HCT 37.9 37.4 37.5  PLT 236 226 238   BMET  Recent Labs  10/10/13 0920 10/10/13 1717 10/11/13 0226  NA 139  --  138  K 3.9  --  4.0  CL 100  --  101  CO2 26  --  26  GLUCOSE 142*  --  160*  BUN 9  --  10  CREATININE 0.67 0.68 0.71  CALCIUM 9.3  --  8.8   PT/INR  Recent Labs  10/10/13 1717 10/11/13 0226  LABPROT 11.9 12.6  INR 0.89 0.96   Cholesterol  Recent Labs  10/11/13 0226  CHOL 169   Cardiac Panel (last 3 results)  Recent Labs  10/10/13 1505 10/10/13 2110 10/11/13 0158  TROPONINI <0.30 <0.30 <0.30    Assessment/Plan    Principal Problem:   Chest pain Active Problems:   HTN (hypertension)   Hyperlipidemia   Diabetes mellitus   Asthma   CAD S/P percutaneous coronary angioplasty: PROBABLE PER PT DESCRIPTION OF CATH 2006  Plan: Currently CP free. Enzymes negative x 3. Plan for diagnostic LHC +/- PCI. WBC is slightly elevated at 12.3. Pt endorses mild dysuria. afebrile. UA negative for Leukocytes and Nitrates. + for protein. Will likely still cath today. Keep NPO. Dr. Allyson Sabal to follow.     LOS: 1 day    Brittainy M. Delmer Islam 10/11/2013 8:35 AM  Agree with note written by Boyce Medici  [facility]  Runell Gess 10/13/2013 6:47 AM

## 2013-10-11 NOTE — Interval H&P Note (Signed)
Cath Lab Visit (complete for each Cath Lab visit)  Clinical Evaluation Leading to the Procedure:   ACS: yes  Non-ACS:    Anginal Classification: CCS IV  Anti-ischemic medical therapy: Minimal Therapy (1 class of medications)  Non-Invasive Test Results: No non-invasive testing performed  Prior CABG: No previous CABG      History and Physical Interval Note:  10/11/2013 9:32 AM  Carmen Moore  has presented today for surgery, with the diagnosis of Chest pain  The various methods of treatment have been discussed with the patient and family. After consideration of risks, benefits and other options for treatment, the patient has consented to  Procedure(s): LEFT HEART CATHETERIZATION WITH CORONARY ANGIOGRAM (N/A) as a surgical intervention .  The patient's history has been reviewed, patient examined, no change in status, stable for surgery.  I have reviewed the patient's chart and labs.  Questions were answered to the patient's satisfaction.     Runell Gess

## 2013-10-11 NOTE — Discharge Summary (Signed)
Physician Discharge Summary  Carmen Moore ZOX:[id number] DOB: 08-Oct-1966 DOA: 10/10/2013  PCP: No PCP Per Patient  Admit date: 10/10/2013 Discharge date: 10/11/2013  Time spent: >35 minutes  Recommendations for Outpatient Follow-up:  F/u with PCP in 1-2 weeks Discharge Diagnoses:  Principal Problem:   Chest pain Active Problems:   HTN (hypertension)   Hyperlipidemia   Diabetes mellitus   Asthma   CAD S/P percutaneous coronary angioplasty: PROBABLE PER PT DESCRIPTION OF CATH 2006   Discharge Condition: stable   Diet recommendation: heart healthy   Filed Weights   10/10/13 1700  Weight: 149.233 kg (329 lb)    History of present illness:  47 y.o. female Who was visiting from [place], with history of hypertension, hyperlipidemia, asthma, diabetes, probable coronary artery disease about 8 years ago as patient describes a cardiac catheterization with cleaning of vessels who presents to the ED with sudden onset midsternal to left substernal chest pain radiating to the left upper extremity described as a squeezing sensation with left upper extremity numbness   Hospital Course:  1. Chest pain, trop neg; ecg: t wave inversion;  -CP resolved, s/p LHC: normal coronaries; cont BB, ASA, statin, ACE;  2. Hypertension; Resume home regimen  3. Hyperlipidemia Continue statin  4. DM Hgb A1C-7.8. unclear history; f/u with PCP next week  5. Asthma Nebs as needed.  apparently patient was d/c from cath lab per cardiologist      Procedures:  LHC (i.e. Studies not automatically included, echos, thoracentesis, etc; not x-rays)  Consultations:  Cardiology   Discharge Exam: Filed Vitals:   10/11/13 0947  BP:   Pulse: 80  Temp:   Resp:     General: alert Cardiovascular: s1,s2 rr Respiratory: CTA BL  Discharge Instructions  Discharge Orders   Future Appointments Provider Department Dept Phone   10/20/2013 1:40 PM Abelino Derrick, PA-C [facility] [phone]   Future  Orders Complete By Expires   Diet - low sodium heart healthy  As directed    Increase activity slowly  As directed        Medication List         albuterol 108 (90 BASE) MCG/ACT inhaler  Commonly known as:  PROVENTIL HFA;VENTOLIN HFA  Inhale 1-2 puffs into the lungs every 6 (six) hours as needed for wheezing or shortness of breath.     aspirin 81 MG tablet  Take 81 mg by mouth daily.     furosemide 40 MG tablet  Commonly known as:  LASIX  Take 40 mg by mouth daily.     lisinopril 10 MG tablet  Commonly known as:  PRINIVIL,ZESTRIL  Take 10 mg by mouth daily.     metoprolol 50 MG tablet  Commonly known as:  LOPRESSOR  Take 50 mg by mouth daily.     potassium chloride SA 20 MEQ tablet  Commonly known as:  K-DUR,KLOR-CON  Take 20 mEq by mouth daily.     simvastatin 40 MG tablet  Commonly known as:  ZOCOR  Take 40 mg by mouth daily.       Allergies  Allergen Reactions  . Tomato   . Beef-Derived Products Diarrhea  . Nitroglycerin Itching  . Latex Rash       Follow-up Information   Follow up with No PCP Per Patient.   Specialty:  General Practice   Contact information:   [address] [place] [address] [phone]       Follow up with Abelino Derrick, PA-C On  10/20/2013. (1:40 pm for site check, OK to cancel if you are doing well)    Specialty:  Cardiology   Contact information:   [address] [place] [address] [phone]        The results of significant diagnostics from this hospitalization (including imaging, microbiology, ancillary and laboratory) are listed below for reference.    Significant Diagnostic Studies: Dg Chest 2 View  10/10/2013   CLINICAL DATA:  Chest pain  EXAM: CHEST  2 VIEW  COMPARISON:  October 05, 2013  FINDINGS: There is no edema or consolidation. Heart is enlarged with normal pulmonary vascularity. No adenopathy. There is degenerative change in the thoracic spine.  IMPRESSION: Cardiomegaly.  No edema or  consolidation.   Electronically Signed   By: Bretta Bang M.D.   On: 10/10/2013 09:01   Dg Chest 2 View (if Patient Has Fever And/or Copd)  10/05/2013   CLINICAL DATA:  Shortness of breath, cough, and vomiting.  EXAM: CHEST  2 VIEW  COMPARISON:  None.  FINDINGS: The heart size and mediastinal contours are within normal limits. Both lungs are clear. The visualized skeletal structures are unremarkable.  IMPRESSION: Normal exam.   Electronically Signed   By: Geanie Cooley M.D.   On: 10/05/2013 17:15    Microbiology: Recent Results (from the past 240 hour(s))  MRSA PCR SCREENING     Status: None   Collection Time    10/10/13  6:15 PM      Result Value Range Status   MRSA by PCR NEGATIVE  NEGATIVE Final   Comment:            The GeneXpert MRSA Assay (FDA     approved for NASAL specimens     only), is one component of a     comprehensive MRSA colonization     surveillance program. It is not     intended to diagnose MRSA     infection nor to guide or     monitor treatment for     MRSA infections.     Labs: Basic Metabolic Panel:  Recent Labs Lab 10/10/13 0920 10/10/13 1717 10/11/13 0226  NA 139  --  138  K 3.9  --  4.0  CL 100  --  101  CO2 26  --  26  GLUCOSE 142*  --  160*  BUN 9  --  10  CREATININE 0.67 0.68 0.71  CALCIUM 9.3  --  8.8  MG  --  2.0  --    Liver Function Tests:  Recent Labs Lab 10/10/13 0920  AST 16  ALT 12  ALKPHOS 121*  BILITOT 0.3  PROT 7.7  ALBUMIN 3.6   No results found for this basename: LIPASE, AMYLASE,  in the last 168 hours No results found for this basename: AMMONIA,  in the last 168 hours CBC:  Recent Labs Lab 10/10/13 0920 10/10/13 1717 10/11/13 0226  WBC 10.3 10.5 12.3*  HGB 12.2 11.9* 11.7*  HCT 37.9 37.4 37.5  MCV 82.8 82.7 83.5  PLT 236 226 238   Cardiac Enzymes:  Recent Labs Lab 10/10/13 0920 10/10/13 1505 10/10/13 2110 10/11/13 0158  TROPONINI <0.30 <0.30 <0.30 <0.30   BNP: BNP (last 3  results)  Recent Labs  10/10/13 0920  PROBNP 44.7   CBG:  Recent Labs Lab 10/10/13 1653 10/10/13 2154 10/11/13 0743 10/11/13 1021  GLUCAP 82 109* 118* 119*       Signed:  Dorota Heinrichs N  [facility] 10/11/2013, 5:20 PM

## 2013-10-11 NOTE — Progress Notes (Signed)
ANTICOAGULATION CONSULT NOTE - Follow Up Consult  Pharmacy Consult for Heparin  Indication: chest pain/ACS  Allergies  Allergen Reactions  . Tomato   . Beef-Derived Products Diarrhea  . Nitroglycerin Itching  . Latex Rash    Patient Measurements: Height: 5\' 8"  (172.7 cm) Weight: 329 lb (149.233 kg) IBW/kg (Calculated) : 63.9 Heparin Dosing Weight: ~103 kg  Vital Signs: Temp: 98.3 F (36.8 C) (12/23 2327) Temp src: Oral (12/23 2327) BP: 125/68 mmHg (12/24 0000) Pulse Rate: 74 (12/24 0000)  Labs:  Recent Labs  10/10/13 0920 10/10/13 1505 10/10/13 1717 10/10/13 2110 10/11/13 0015  HGB 12.2  --  11.9*  --   --   HCT 37.9  --  37.4  --   --   PLT 236  --  226  --   --   APTT  --   --  30  --   --   LABPROT  --   --  11.9  --   --   INR  --   --  0.89  --   --   HEPARINUNFRC  --   --   --   --  0.33  CREATININE 0.67  --  0.68  --   --   TROPONINI <0.30 <0.30  --  <0.30  --     Estimated Creatinine Clearance: 135.9 ml/min (by C-G formula based on Cr of 0.68).   Medications:  Heparin 1600 units/hr  Assessment: 47 y/o F on heparin for CP. First HL is 0.33 (drawn about one hour early). Other labs as above.   Goal of Therapy:  Heparin level 0.3-0.7 units/ml Monitor platelets by anticoagulation protocol: Yes   Plan:  -Increase heparin drip to 1800 units/hr  -0900 HL -Daily CBC/HL -Monitor for bleeding  Thank you for allowing me to take part in this patient's care,  Abran Duke, PharmD Clinical Pharmacist Phone: [phone] Pager: [phone] 10/11/2013 2:35 AM

## 2013-10-11 NOTE — Progress Notes (Signed)
Occupational Therapy Note  OT cancelled this am as pt is in cath lab and unavailable.  Will reattempt today as schedule allows.  Jeani Hawking, OTR/L [phone]

## 2013-10-12 LAB — URINE CULTURE: Colony Count: >=100000

## 2013-10-20 ENCOUNTER — Ambulatory Visit (INDEPENDENT_AMBULATORY_CARE_PROVIDER_SITE_OTHER): Payer: Medicare (Managed Care) | Admitting: Cardiology

## 2013-10-20 ENCOUNTER — Encounter: Payer: Self-pay | Admitting: Cardiology

## 2013-10-20 VITALS — BP 152/90 | HR 96 | Ht 65.0 in | Wt 326.1 lb

## 2013-10-20 DIAGNOSIS — I10 Essential (primary) hypertension: Secondary | ICD-10-CM

## 2013-10-20 DIAGNOSIS — H9190 Unspecified hearing loss, unspecified ear: Secondary | ICD-10-CM | POA: Insufficient documentation

## 2013-10-20 DIAGNOSIS — Z0389 Encounter for observation for other suspected diseases and conditions ruled out: Secondary | ICD-10-CM

## 2013-10-20 DIAGNOSIS — E119 Type 2 diabetes mellitus without complications: Secondary | ICD-10-CM

## 2013-10-20 DIAGNOSIS — R079 Chest pain, unspecified: Secondary | ICD-10-CM

## 2013-10-20 DIAGNOSIS — H9193 Unspecified hearing loss, bilateral: Secondary | ICD-10-CM

## 2013-10-20 DIAGNOSIS — J45909 Unspecified asthma, uncomplicated: Secondary | ICD-10-CM

## 2013-10-20 DIAGNOSIS — IMO0001 Reserved for inherently not codable concepts without codable children: Secondary | ICD-10-CM | POA: Insufficient documentation

## 2013-10-20 MED ORDER — LEVALBUTEROL TARTRATE 45 MCG/ACT IN AERO: 2.0000 | INHALATION_SPRAY | Freq: Four times a day (QID) | RESPIRATORY_TRACT | Status: DC | PRN

## 2013-10-20 MED ORDER — LEVALBUTEROL TARTRATE 45 MCG/ACT IN AERO: 2.0000 | INHALATION_SPRAY | Freq: Four times a day (QID) | RESPIRATORY_TRACT | Status: AC | PRN

## 2013-10-20 NOTE — Patient Instructions (Signed)
Stop Proventil inhaler, try Xopenex HFA inhaler as directed.

## 2013-10-20 NOTE — Progress Notes (Signed)
10/20/2013 Carmen Moore   08-Jun-1966  [id number]  Primary Physicia No PCP Per Patient Primary Cardiologist: Dr Gwenlyn Found  HPI:  48 y/o morbidly obese deaf female from [place] who presented with chest pain worrisome for [place]. She had had a cath in [place] some years ago and we though by her history she had had a PCI. She was cathed by Dr Gwenlyn Found and her coronaries were normal. She is here for post cath check. She is returning to [place] Sunday. She wanted an Rx for Glucophage which she had been out of for several months but her sugars in the hospital were fairly well controlled. She also said her asthma was bothering her and wanted an new inhaler.   Current Outpatient Prescriptions  Medication Sig Dispense Refill  . aspirin 81 MG tablet Take 81 mg by mouth daily.      Marland Kitchen lisinopril (PRINIVIL,ZESTRIL) 10 MG tablet Take 10 mg by mouth daily.      . metoprolol (LOPRESSOR) 50 MG tablet Take 50 mg by mouth daily.      . simvastatin (ZOCOR) 40 MG tablet Take 40 mg by mouth daily.      . furosemide (LASIX) 40 MG tablet Take 40 mg by mouth daily.      Marland Kitchen levalbuterol (XOPENEX HFA) 45 MCG/ACT inhaler Inhale 2 puffs into the lungs every 6 (six) hours as needed for wheezing.  1 Inhaler  0  . levalbuterol (XOPENEX HFA) 45 MCG/ACT inhaler Inhale 2 puffs into the lungs every 6 (six) hours as needed for wheezing.      . potassium chloride SA (K-DUR,KLOR-CON) 20 MEQ tablet Take 20 mEq by mouth daily.       No current facility-administered medications for this visit.    Allergies  Allergen Reactions  . Tomato   . Beef-Derived Products Diarrhea  . Nitroglycerin Itching  . Latex Rash    History   Social History  . Marital Status: Single    Spouse Name: N/A    Number of Children: N/A  . Years of Education: N/A   Occupational History  . Not on file.   Social History Main Topics  . Smoking status: Never Smoker   . Smokeless tobacco: Never Used  . Alcohol Use: No  . Drug Use: No  . Sexual Activity: Not  Currently   Other Topics Concern  . Not on file   Social History Narrative  . No narrative on file     Review of Systems: General: negative for chills, fever, night sweats or weight changes.  Cardiovascular: negative for chest pain, dyspnea on exertion, edema, orthopnea, palpitations, paroxysmal nocturnal dyspnea or shortness of breath Dermatological: negative for rash Respiratory: negative for cough or wheezing Urologic: negative for hematuria Abdominal: negative for nausea, vomiting, diarrhea, bright red blood per rectum, melena, or hematemesis Neurologic: negative for visual changes, syncope, or dizziness All other systems reviewed and are otherwise negative except as noted above.    Blood pressure 152/90, pulse 96, height 5\' 5"  (1.651 m), weight 326 lb 1.6 oz (147.918 kg).  General appearance: alert, cooperative, no distress, morbidly obese and deaf Lungs: expiratory wheezing Heart: regular rate and rhythm  EKG NSR  ASSESSMENT AND PLAN:   Chest pain .  Normal coronary arteries .  HTN (hypertension) .  Asthma .  Diabetes mellitus Sugars under pretty good control  Morbid obesity .  Deafness .   PLAN  I stopped her Proventil and gave her an Rx for Xopenex HFA. I encouraged her to  follow up with her primary MD in [place]. An interpreter was present today during my exam and interview.  Goran Olden KPA-C 10/20/2013 5:08 PM

## 2013-10-20 NOTE — Assessment & Plan Note (Signed)
Sugars under pretty good control

## 2013-10-23 NOTE — ED Provider Notes (Signed)
Medical screening examination/treatment/procedure(s) were conducted as a shared visit with non-physician practitioner(s) and myself.  I personally evaluated the patient during the encounter.  EKG Interpretation    Date/Time:  Tuesday October 10 2013 08:32:34 EST Ventricular Rate:  79 PR Interval:  147 QRS Duration: 78 QT Interval:  383 QTC Calculation: 439 R Axis:   51 Text Interpretation:  Sinus rhythm Abnormal T, consider ischemia, diffuse leads No previous tracing Confirmed by Reiss Mowrey  MD, Bradrick Kamau ([id number]) on 10/10/2013 8:39:09 AM            Pt with recent cp/discomfort. No sob. Chest cta. abd soft nt. Labs. Cxr. ecg.  Mirna Mires, MD 10/23/13 [phone]

## 2014-09-27 ENCOUNTER — Encounter (HOSPITAL_COMMUNITY): Payer: Self-pay | Admitting: Cardiovascular Disease

## 2014-12-06 ENCOUNTER — Encounter (HOSPITAL_BASED_OUTPATIENT_CLINIC_OR_DEPARTMENT_OTHER): Payer: Self-pay

## 2014-12-06 ENCOUNTER — Emergency Department (HOSPITAL_BASED_OUTPATIENT_CLINIC_OR_DEPARTMENT_OTHER): Payer: Medicare (Managed Care)

## 2014-12-06 ENCOUNTER — Emergency Department (HOSPITAL_BASED_OUTPATIENT_CLINIC_OR_DEPARTMENT_OTHER)
Admission: EM | Admit: 2014-12-06 | Discharge: 2014-12-07 | Disposition: A | Discharge status: Home / Self Care | Payer: Medicare (Managed Care) | Attending: Emergency Medicine | Admitting: Emergency Medicine

## 2014-12-06 DIAGNOSIS — I2510 Atherosclerotic heart disease of native coronary artery without angina pectoris: Secondary | ICD-10-CM | POA: Diagnosis not present

## 2014-12-06 DIAGNOSIS — E785 Hyperlipidemia, unspecified: Secondary | ICD-10-CM | POA: Diagnosis not present

## 2014-12-06 DIAGNOSIS — Z7982 Long term (current) use of aspirin: Secondary | ICD-10-CM | POA: Diagnosis not present

## 2014-12-06 DIAGNOSIS — N938 Other specified abnormal uterine and vaginal bleeding: Secondary | ICD-10-CM

## 2014-12-06 DIAGNOSIS — Z9889 Other specified postprocedural states: Secondary | ICD-10-CM | POA: Insufficient documentation

## 2014-12-06 DIAGNOSIS — Z91040 Latex allergy status: Secondary | ICD-10-CM | POA: Insufficient documentation

## 2014-12-06 DIAGNOSIS — N858 Other specified noninflammatory disorders of uterus: Secondary | ICD-10-CM | POA: Diagnosis not present

## 2014-12-06 DIAGNOSIS — Z79899 Other long term (current) drug therapy: Secondary | ICD-10-CM | POA: Insufficient documentation

## 2014-12-06 DIAGNOSIS — E119 Type 2 diabetes mellitus without complications: Secondary | ICD-10-CM | POA: Diagnosis not present

## 2014-12-06 DIAGNOSIS — J45909 Unspecified asthma, uncomplicated: Secondary | ICD-10-CM | POA: Insufficient documentation

## 2014-12-06 DIAGNOSIS — R110 Nausea: Secondary | ICD-10-CM | POA: Diagnosis not present

## 2014-12-06 DIAGNOSIS — H9190 Unspecified hearing loss, unspecified ear: Secondary | ICD-10-CM | POA: Diagnosis not present

## 2014-12-06 DIAGNOSIS — M549 Dorsalgia, unspecified: Secondary | ICD-10-CM | POA: Insufficient documentation

## 2014-12-06 DIAGNOSIS — R102 Pelvic and perineal pain: Secondary | ICD-10-CM

## 2014-12-06 DIAGNOSIS — N939 Abnormal uterine and vaginal bleeding, unspecified: Secondary | ICD-10-CM | POA: Diagnosis present

## 2014-12-06 DIAGNOSIS — N9489 Other specified conditions associated with female genital organs and menstrual cycle: Secondary | ICD-10-CM

## 2014-12-06 LAB — BASIC METABOLIC PANEL
ANION GAP: 3 — AB (ref 5–15)
BUN: 12 mg/dL (ref 6–23)
CO2: 29 mmol/L (ref 19–32)
Calcium: 8.9 mg/dL (ref 8.4–10.5)
Chloride: 102 mmol/L (ref 96–112)
Creatinine, Ser: 0.64 mg/dL (ref 0.50–1.10)
GFR calc Af Amer: >90 mL/min (ref >90)
GFR calc non Af Amer: >90 mL/min (ref >90)
Glucose, Bld: 172 mg/dL — ABNORMAL HIGH (ref 70–99)
POTASSIUM: 3.9 mmol/L (ref 3.5–5.1)
SODIUM: 134 mmol/L — AB (ref 135–145)

## 2014-12-06 LAB — WET PREP, GENITAL
CLUE CELLS WET PREP: NONE SEEN
TRICH WET PREP: NONE SEEN
WBC WET PREP: NONE SEEN
YEAST WET PREP: NONE SEEN

## 2014-12-06 LAB — URINALYSIS, ROUTINE W REFLEX MICROSCOPIC
Bilirubin Urine: NEGATIVE
Glucose, UA: NEGATIVE mg/dL
Hgb urine dipstick: NEGATIVE
Ketones, ur: NEGATIVE mg/dL
Leukocytes, UA: NEGATIVE
NITRITE: NEGATIVE
PROTEIN: NEGATIVE mg/dL
Specific Gravity, Urine: 1.017 (ref 1.005–1.030)
UROBILINOGEN UA: 0.2 mg/dL (ref 0.0–1.0)
pH: 7 (ref 5.0–8.0)

## 2014-12-06 LAB — CBC WITH DIFFERENTIAL/PLATELET
BASOS PCT: 0 % (ref 0–1)
Basophils Absolute: 0 10*3/uL (ref 0.0–0.1)
EOS ABS: 0.2 10*3/uL (ref 0.0–0.7)
Eosinophils Relative: 2 % (ref 0–5)
HCT: 36.8 % (ref 36.0–46.0)
Hemoglobin: 11.3 g/dL — ABNORMAL LOW (ref 12.0–15.0)
LYMPHS ABS: 2.7 10*3/uL (ref 0.7–4.0)
Lymphocytes Relative: 28 % (ref 12–46)
MCH: 24.8 pg — ABNORMAL LOW (ref 26.0–34.0)
MCHC: 30.7 g/dL (ref 30.0–36.0)
MCV: 80.9 fL (ref 78.0–100.0)
MONOS PCT: 8 % (ref 3–12)
Monocytes Absolute: 0.8 10*3/uL (ref 0.1–1.0)
NEUTROS PCT: 62 % (ref 43–77)
Neutro Abs: 6 10*3/uL (ref 1.7–7.7)
PLATELETS: 220 10*3/uL (ref 150–400)
RBC: 4.55 MIL/uL (ref 3.87–5.11)
RDW: 14.9 % (ref 11.5–15.5)
WBC: 9.7 10*3/uL (ref 4.0–10.5)

## 2014-12-06 LAB — HCG, SERUM, QUALITATIVE: PREG SERUM: NEGATIVE

## 2014-12-06 MED ORDER — KETOROLAC TROMETHAMINE 60 MG/2ML IM SOLN
60.0000 mg | Freq: Once | INTRAMUSCULAR | Status: AC
  Administered 2014-12-06: 60 mg via INTRAMUSCULAR
  Filled 2014-12-06: qty 2

## 2014-12-06 MED ORDER — NAPROXEN 500 MG PO TABS: 500.0000 mg | ORAL_TABLET | Freq: Two times a day (BID) | ORAL | Status: DC

## 2014-12-06 MED ORDER — HYDROCODONE-ACETAMINOPHEN 5-325 MG PO TABS: 1.0000 | ORAL_TABLET | ORAL | Status: DC | PRN

## 2014-12-06 MED ORDER — ONDANSETRON 8 MG PO TBDP
ORAL_TABLET | ORAL | Status: AC
  Administered 2014-12-06: 8 mg via ORAL
  Filled 2014-12-06: qty 1

## 2014-12-06 MED ORDER — ONDANSETRON HCL 4 MG PO TABS: 4.0000 mg | ORAL_TABLET | Freq: Four times a day (QID) | ORAL | Status: DC

## 2014-12-06 MED ORDER — ONDANSETRON 8 MG PO TBDP
8.0000 mg | ORAL_TABLET | Freq: Once | ORAL | Status: AC
  Administered 2014-12-06: 8 mg via ORAL

## 2014-12-06 MED ORDER — OXYCODONE-ACETAMINOPHEN 5-325 MG PO TABS
1.0000 | ORAL_TABLET | Freq: Once | ORAL | Status: AC
  Administered 2014-12-06: 1 via ORAL
  Filled 2014-12-06: qty 1

## 2014-12-06 NOTE — ED Notes (Signed)
Sign language interpreter line called- voice message left.

## 2014-12-06 NOTE — ED Notes (Signed)
Nephew reports vaginal bleeding x 9 months. Pt is deaf. Recently moved from [place].

## 2014-12-06 NOTE — ED Notes (Signed)
Pt ambulating to bathroom.

## 2014-12-06 NOTE — ED Provider Notes (Signed)
CSN: [id number]     Arrival date & time 12/06/14  1802 History   First MD Initiated Contact with Patient 12/06/14 1844     Chief Complaint  Patient presents with  . Vaginal Bleeding     (Consider location/radiation/quality/duration/timing/severity/associated sxs/prior Treatment) Patient is a 49 y.o. female presenting with vaginal bleeding. The history is provided by the patient. History limited by: patient deaf. A language interpreter was used.  Vaginal Bleeding Associated symptoms: abdominal pain, back pain and nausea   Associated symptoms: no fever    Exa Bomba is a 49 y.o. G1 P0 presents to the ED with vaginal bleeding that started 9 months ago. The bleeding has been every day but only spotting. Vaginal d/c with odor. The bleeding today is lighter than a period but she has abdominal pain that caused her to come in today. The pain started today in her lower abdomen and radiates to her back. She describes the pain as cramping. The pain comes and goes. She is not sexually active x 15 years. The last time she had a pap smear was one year ago and was normal. Her GYN is in [place]. He has told her in the past that she had a small fibroid and she had "damage to her uterus" and would never be able to have children. She is visiting here with her family. She will be here for a year.   Past Medical History  Diagnosis Date  . Asthma   . Obesity   . Diabetes mellitus without complication   . Hypertension   . HTN (hypertension) 10/10/2013  . CAD S/P percutaneous coronary angioplasty: PROBABLE PER PT DESCRIPTION OF CATH 2006 10/10/2013  . Hyperlipidemia   . Deaf    Past Surgical History  Procedure Laterality Date  . Hernia repair    . Cardiac catheterization    . Left heart catheterization with coronary angiogram N/A 10/11/2013    Procedure: LEFT HEART CATHETERIZATION WITH CORONARY ANGIOGRAM;  Surgeon: Lorretta Harp, MD;  Location: [facility] CATH LAB;  Service: Cardiovascular;  Laterality: N/A;    Family History  Problem Relation Age of Onset  . Hypertension Mother    History  Substance Use Topics  . Smoking status: Never Smoker   . Smokeless tobacco: Never Used  . Alcohol Use: No   OB History    No data available     Review of Systems  Constitutional: Negative for fever and chills.  HENT: Positive for sneezing.   Gastrointestinal: Positive for nausea and abdominal pain.  Genitourinary: Positive for vaginal bleeding.  Musculoskeletal: Positive for back pain.  all other systems negative    Allergies  Tomato; Beef-derived products; Nitroglycerin; and Latex  Home Medications   Prior to Admission medications   Medication Sig Start Date End Date Taking? Authorizing Provider  aspirin 81 MG tablet Take 81 mg by mouth daily.    Historical Provider, MD  furosemide (LASIX) 40 MG tablet Take 40 mg by mouth daily.    Historical Provider, MD  HYDROcodone-acetaminophen (NORCO/VICODIN) 5-325 MG per tablet Take 1-2 tablets by mouth every 4 (four) hours as needed. 12/06/14   Adaora Mchaney Bunnie Pion, NP  levalbuterol [facility] HFA) 45 MCG/ACT inhaler Inhale 2 puffs into the lungs every 6 (six) hours as needed for wheezing. 10/20/13   Erlene Quan, PA-C  levalbuterol [facility] HFA) 45 MCG/ACT inhaler Inhale 2 puffs into the lungs every 6 (six) hours as needed for wheezing. 10/20/13   Erlene Quan, PA-C  lisinopril (PRINIVIL,ZESTRIL) 10  MG tablet Take 10 mg by mouth daily.    Historical Provider, MD  metoprolol (LOPRESSOR) 50 MG tablet Take 50 mg by mouth daily.    Historical Provider, MD  naproxen (NAPROSYN) 500 MG tablet Take 1 tablet (500 mg total) by mouth 2 (two) times daily. 12/06/14   Nuriya Stuck Bunnie Pion, NP  potassium chloride SA (K-DUR,KLOR-CON) 20 MEQ tablet Take 20 mEq by mouth daily.    Historical Provider, MD  simvastatin (ZOCOR) 40 MG tablet Take 40 mg by mouth daily.    Historical Provider, MD   BP 101/53 mmHg  Pulse 88  Temp(Src) 98.1 F (36.7 C) (Oral)  Resp 18  Ht 5\' 5"  (1.651 m)  Wt  320 lb (145.151 kg)  BMI 53.25 kg/m2  SpO2 99% Physical Exam  Constitutional: She is oriented to person, place, and time. No distress.  Morbidly obese  HENT:  Head: Normocephalic and atraumatic.  Eyes: Conjunctivae and EOM are normal.  Neck: Normal range of motion. Neck supple.  Cardiovascular: Normal rate and regular rhythm.   Pulmonary/Chest: Effort normal and breath sounds normal.  Abdominal: Soft. Bowel sounds are normal. There is tenderness in the right lower quadrant and left lower quadrant. There is no rebound, no guarding and no CVA tenderness.  Genitourinary:  External genitalia without lesions. Scant blood vaginal vault. Positive CMT, bilateral adnexal tenderness. Unable to determine size of uterus due to patient habitus.   Musculoskeletal: Normal range of motion.  Neurological: She is alert and oriented to person, place, and time. No cranial nerve deficit.  Skin: Skin is warm and dry.  Psychiatric: She has a normal mood and affect. Her behavior is normal.  Nursing note and vitals reviewed.   ED Course  Procedures (including critical care time) Labs Review   Results for orders placed or performed during the hospital encounter of 12/06/14 (from the past 24 hour(s))  CBC with Differential/Platelet     Status: Abnormal   Collection Time: 12/06/14  7:50 PM  Result Value Ref Range   WBC 9.7 4.0 - 10.5 K/uL   RBC 4.55 3.87 - 5.11 MIL/uL   Hemoglobin 11.3 (L) 12.0 - 15.0 g/dL   HCT 36.8 36.0 - 46.0 %   MCV 80.9 78.0 - 100.0 fL   MCH 24.8 (L) 26.0 - 34.0 pg   MCHC 30.7 30.0 - 36.0 g/dL   RDW 14.9 11.5 - 15.5 %   Platelets 220 150 - 400 K/uL   Neutrophils Relative % 62 43 - 77 %   Neutro Abs 6.0 1.7 - 7.7 K/uL   Lymphocytes Relative 28 12 - 46 %   Lymphs Abs 2.7 0.7 - 4.0 K/uL   Monocytes Relative 8 3 - 12 %   Monocytes Absolute 0.8 0.1 - 1.0 K/uL   Eosinophils Relative 2 0 - 5 %   Eosinophils Absolute 0.2 0.0 - 0.7 K/uL   Basophils Relative 0 0 - 1 %   Basophils  Absolute 0.0 0.0 - 0.1 K/uL  Basic metabolic panel     Status: Abnormal   Collection Time: 12/06/14  7:50 PM  Result Value Ref Range   Sodium 134 (L) 135 - 145 mmol/L   Potassium 3.9 3.5 - 5.1 mmol/L   Chloride 102 96 - 112 mmol/L   CO2 29 19 - 32 mmol/L   Glucose, Bld 172 (H) 70 - 99 mg/dL   BUN 12 6 - 23 mg/dL   Creatinine, Ser 0.64 0.50 - 1.10 mg/dL   Calcium 8.9 8.4 -  10.5 mg/dL   GFR calc non Af Amer >90 >90 mL/min   GFR calc Af Amer >90 >90 mL/min   Anion gap 3 (L) 5 - 15  hCG, serum, qualitative     Status: None   Collection Time: 12/06/14  7:50 PM  Result Value Ref Range   Preg, Serum NEGATIVE NEGATIVE  Wet prep, genital     Status: None   Collection Time: 12/06/14  9:00 PM  Result Value Ref Range   Yeast Wet Prep HPF POC NONE SEEN NONE SEEN   Trich, Wet Prep NONE SEEN NONE SEEN   Clue Cells Wet Prep HPF POC NONE SEEN NONE SEEN   WBC, Wet Prep HPF POC NONE SEEN NONE SEEN  Urinalysis, Routine w reflex microscopic     Status: None   Collection Time: 12/06/14  9:00 PM  Result Value Ref Range   Color, Urine YELLOW YELLOW   APPearance CLEAR CLEAR   Specific Gravity, Urine 1.017 1.005 - 1.030   pH 7.0 5.0 - 8.0   Glucose, UA NEGATIVE NEGATIVE mg/dL   Hgb urine dipstick NEGATIVE NEGATIVE   Bilirubin Urine NEGATIVE NEGATIVE   Ketones, ur NEGATIVE NEGATIVE mg/dL   Protein, ur NEGATIVE NEGATIVE mg/dL   Urobilinogen, UA 0.2 0.0 - 1.0 mg/dL   Nitrite NEGATIVE NEGATIVE   Leukocytes, UA NEGATIVE NEGATIVE    US Transvaginal Non-ob  12/06/2014   CLINICAL DATA:  Dysfunctional uterine bleeding for 9 months, cramping midline pelvic pain in a female, history asthma, diabetes, hypertension, obesity  EXAM: TRANSABDOMINAL AND TRANSVAGINAL ULTRASOUND OF PELVIS  TECHNIQUE: Both transabdominal and transvaginal ultrasound examinations of the pelvis were performed. Transabdominal technique was performed for global imaging of the pelvis including uterus, ovaries, adnexal regions, and pelvic  cul-de-sac. It was necessary to proceed with endovaginal exam following the transabdominal exam to visualize the uterus, ovaries and endometrium.  COMPARISON:  None  FINDINGS: Uterus  Measurements: 10.2 x 6.5 x 7.8 cm. Mildly heterogeneous myometrial echogenicity. Two hypoechoic nodules are seen which may represent small leiomyomata, anteriorly 2.6 x 2.8 x 3.4 cm and posteriorly 3.3 x 2.2 x 2.7 cm.  Endometrium  Thickness: 13 mm thick. Heterogeneous appearance with endometrial fluid and a slightly prominent hyperechoic focus centrally, unable to exclude endometrial mass/polyp.  Right ovary  Not definitely identified on either transabdominal or endovaginal imaging question related to obscuration by bowel.  Left ovary  Not identified on either transabdominal or endovaginal imaging question related to obscuration by bowel.  Other findings  No free-fluid or adnexal masses identified.  IMPRESSION: Question to a small uterine leiomyomata.  Heterogeneous endometrial complex containing fluid and question polyp versus mass.  Consider further evaluation with sonohysterogram for confirmation prior to hysteroscopy. Endometrial sampling should also be considered if patient is at high risk for endometrial carcinoma. (Ref: Radiological Reasoning: Algorithmic Workup of Abnormal Vaginal Bleeding with Endovaginal Sonography and Sonohysterography. AJR 2008; 353:G99-24)   Electronically Signed   By: Lavonia Dana M.D.   On: 12/06/2014 22:26   US Pelvis Complete  12/06/2014   CLINICAL DATA:  Dysfunctional uterine bleeding for 9 months, cramping midline pelvic pain in a female, history asthma, diabetes, hypertension, obesity  EXAM: TRANSABDOMINAL AND TRANSVAGINAL ULTRASOUND OF PELVIS  TECHNIQUE: Both transabdominal and transvaginal ultrasound examinations of the pelvis were performed. Transabdominal technique was performed for global imaging of the pelvis including uterus, ovaries, adnexal regions, and pelvic cul-de-sac. It was  necessary to proceed with endovaginal exam following the transabdominal exam to visualize the uterus,  ovaries and endometrium.  COMPARISON:  None  FINDINGS: Uterus  Measurements: 10.2 x 6.5 x 7.8 cm. Mildly heterogeneous myometrial echogenicity. Two hypoechoic nodules are seen which may represent small leiomyomata, anteriorly 2.6 x 2.8 x 3.4 cm and posteriorly 3.3 x 2.2 x 2.7 cm.  Endometrium  Thickness: 13 mm thick. Heterogeneous appearance with endometrial fluid and a slightly prominent hyperechoic focus centrally, unable to exclude endometrial mass/polyp.  Right ovary  Not definitely identified on either transabdominal or endovaginal imaging question related to obscuration by bowel.  Left ovary  Not identified on either transabdominal or endovaginal imaging question related to obscuration by bowel.  Other findings  No free-fluid or adnexal masses identified.  IMPRESSION: Question to a small uterine leiomyomata.  Heterogeneous endometrial complex containing fluid and question polyp versus mass.  Consider further evaluation with sonohysterogram for confirmation prior to hysteroscopy. Endometrial sampling should also be considered if patient is at high risk for endometrial carcinoma. (Ref: Radiological Reasoning: Algorithmic Workup of Abnormal Vaginal Bleeding with Endovaginal Sonography and Sonohysterography. AJR 2008; 008:Q76-19)   Electronically Signed   By: Lavonia Dana M.D.   On: 12/06/2014 22:26    Toradol 60 mg IM for pain.  MDM  49 y.o. female with vaginal bleeding x 9 months and abdominal pain today. Stable for d/c to follow up with [facility] for further evaluation. She will go to [facility] for worsening symptoms. Stable for d/c without hemorrhage and without acute abdomen. Will treat for pain.   Final diagnoses:  Pelvic pain in female  Endometrial mass      [facility], NP 12/06/14 5093  Ezequiel Essex, MD 12/07/14 [phone]

## 2014-12-06 NOTE — Discharge Instructions (Signed)
You will need to follow up with your GYN as soon as possible. Take the pain medication as directed. Do not drive while taking the narcotic as it will make you sleepy. Return here as needed.  Abdominal Pain Many things can cause abdominal pain. Usually, abdominal pain is not caused by a disease and will improve without treatment. It can often be observed and treated at home. Your health care provider will do a physical exam and possibly order blood tests and X-rays to help determine the seriousness of your pain. However, in many cases, more time must pass before a clear cause of the pain can be found. Before that point, your health care provider may not know if you need more testing or further treatment. HOME CARE INSTRUCTIONS  Monitor your abdominal pain for any changes. The following actions may help to alleviate any discomfort you are experiencing:  Only take over-the-counter or prescription medicines as directed by your health care provider.  Do not take laxatives unless directed to do so by your health care provider.  Try a clear liquid diet (broth, tea, or water) as directed by your health care provider. Slowly move to a bland diet as tolerated. SEEK MEDICAL CARE IF:  You have unexplained abdominal pain.  You have abdominal pain associated with nausea or diarrhea.  You have pain when you urinate or have a bowel movement.  You experience abdominal pain that wakes you in the night.  You have abdominal pain that is worsened or improved by eating food.  You have abdominal pain that is worsened with eating fatty foods.  You have a fever. SEEK IMMEDIATE MEDICAL CARE IF:   Your pain does not go away within 2 hours.  You keep throwing up (vomiting).  Your pain is felt only in portions of the abdomen, such as the right side or the left lower portion of the abdomen.  You pass bloody or black tarry stools. MAKE SURE YOU:  Understand these instructions.   Will watch your condition.    Will get help right away if you are not doing well or get worse.  Document Released: 07/15/2005 Document Revised: 10/10/2013 Document Reviewed: 06/14/2013 [facility] Patient Information 2015 [place], [place]. This information is not intended to replace advice given to you by your health care provider. Make sure you discuss any questions you have with your health care provider.

## 2014-12-06 NOTE — ED Notes (Signed)
ETA sign language interpreter 15-20 minutes.

## 2014-12-07 LAB — GC/CHLAMYDIA PROBE AMP (~~LOC~~) NOT AT ARMC
CHLAMYDIA, DNA PROBE: NEGATIVE
NEISSERIA GONORRHEA: NEGATIVE

## 2014-12-31 ENCOUNTER — Encounter: Payer: Self-pay | Admitting: Obstetrics & Gynecology

## 2014-12-31 ENCOUNTER — Ambulatory Visit (INDEPENDENT_AMBULATORY_CARE_PROVIDER_SITE_OTHER): Payer: Medicare (Managed Care) | Admitting: Obstetrics & Gynecology

## 2014-12-31 VITALS — BP 135/77 | HR 72 | Temp 98.7°F | Ht 65.0 in | Wt 322.1 lb

## 2014-12-31 DIAGNOSIS — D259 Leiomyoma of uterus, unspecified: Secondary | ICD-10-CM

## 2014-12-31 DIAGNOSIS — N921 Excessive and frequent menstruation with irregular cycle: Secondary | ICD-10-CM | POA: Insufficient documentation

## 2014-12-31 DIAGNOSIS — D219 Benign neoplasm of connective and other soft tissue, unspecified: Secondary | ICD-10-CM | POA: Insufficient documentation

## 2014-12-31 MED ORDER — MEGESTROL ACETATE 20 MG PO TABS: 40.0000 mg | ORAL_TABLET | Freq: Every day | ORAL | Status: DC

## 2014-12-31 NOTE — Progress Notes (Signed)
Patient ID: Carmen Moore, female   DOB: May 15, 1966, 49 y.o.   MRN: [id number]  Chief Complaint  Patient presents with  . Menstrual Problem  . Pelvic Pain    HPI [id number]  Carmen Moore is a 49 y.o. female.  9 months of irregular bleeding, had hysteroscopy or biopsy in [place] 6 mo ago, Cramps and bleeding continues today HPI  Past Medical History  Diagnosis Date  . Asthma   . Obesity   . Diabetes mellitus without complication   . Hypertension   . HTN (hypertension) 10/10/2013  . CAD S/P percutaneous coronary angioplasty: PROBABLE PER PT DESCRIPTION OF CATH 2006 10/10/2013  . Hyperlipidemia   . Deaf   . Fibroids     Past Surgical History  Procedure Laterality Date  . Hernia repair    . Cardiac catheterization    . Left heart catheterization with coronary angiogram N/A 10/11/2013    Procedure: LEFT HEART CATHETERIZATION WITH CORONARY ANGIOGRAM;  Surgeon: Lorretta Harp, MD;  Location: [facility] CATH LAB;  Service: Cardiovascular;  Laterality: N/A;  . Dilation and curettage of uterus      Family History  Problem Relation Age of Onset  . Hypertension Mother     Social History History  Substance Use Topics  . Smoking status: Never Smoker   . Smokeless tobacco: Never Used  . Alcohol Use: No    Allergies  Allergen Reactions  . Tomato   . Beef-Derived Products Diarrhea  . Nitroglycerin Itching  . Pollen Extract   . Latex Rash    Current Outpatient Prescriptions  Medication Sig Dispense Refill  . aspirin 81 MG tablet Take 81 mg by mouth daily.    Marland Kitchen HYDROcodone-acetaminophen (NORCO/VICODIN) 5-325 MG per tablet Take 1-2 tablets by mouth every 4 (four) hours as needed. 20 tablet 0  . levalbuterol (XOPENEX HFA) 45 MCG/ACT inhaler Inhale 2 puffs into the lungs every 6 (six) hours as needed for wheezing. 1 Inhaler 0  . lisinopril (PRINIVIL,ZESTRIL) 10 MG tablet Take 10 mg by mouth daily.    . metoprolol (LOPRESSOR) 50 MG tablet Take 50 mg by mouth daily.    . potassium chloride SA  (K-DUR,KLOR-CON) 20 MEQ tablet Take 20 mEq by mouth daily.    Marland Kitchen PRESCRIPTION MEDICATION 1 tablet daily. Metformin- Not sure of dose , thinks it may be 500mg .    . simvastatin (ZOCOR) 40 MG tablet Take 40 mg by mouth daily.    . megestrol (MEGACE) 20 MG tablet Take 2 tablets (40 mg total) by mouth daily. 30 tablet 3  . naproxen (NAPROSYN) 500 MG tablet Take 1 tablet (500 mg total) by mouth 2 (two) times daily. (Patient not taking: Reported on 12/31/2014) 30 tablet 0  . ondansetron (ZOFRAN) 4 MG tablet Take 1 tablet (4 mg total) by mouth every 6 (six) hours. (Patient not taking: Reported on 12/31/2014) 12 tablet 0   No current facility-administered medications for this visit.    Review of Systems Review of Systems  Respiratory: Negative.   Genitourinary: Positive for vaginal bleeding, vaginal discharge (odor), menstrual problem and pelvic pain.    Blood pressure 135/77, pulse 72, temperature 98.7 F (37.1 C), height 5\' 5"  (1.651 m), weight 322 lb 1.6 oz (146.104 kg).  Physical Exam Physical Exam  Constitutional:  Obese, uses sign language  Pulmonary/Chest: Effort normal.  Genitourinary: Vagina normal and uterus normal. No vaginal discharge (blood) found.  NT, no mass  Neurological: She is alert.  Skin: Skin is warm.  Psychiatric: She has  a normal mood and affect. Her behavior is normal.    Data Reviewed CBC    Component Value Date/Time   WBC 9.7 12/06/2014 1950   RBC 4.55 12/06/2014 1950   HGB 11.3* 12/06/2014 1950   HCT 36.8 12/06/2014 1950   PLT 220 12/06/2014 1950   MCV 80.9 12/06/2014 1950   MCH 24.8* 12/06/2014 1950   MCHC 30.7 12/06/2014 1950   RDW 14.9 12/06/2014 1950   LYMPHSABS 2.7 12/06/2014 1950   MONOABS 0.8 12/06/2014 1950   EOSABS 0.2 12/06/2014 1950   BASOSABS 0.0 12/06/2014 1950      CLINICAL DATA: Dysfunctional uterine bleeding for 9 months, cramping midline pelvic pain in a female, history asthma, diabetes, hypertension,  obesity  EXAM: TRANSABDOMINAL AND TRANSVAGINAL ULTRASOUND OF PELVIS  TECHNIQUE: Both transabdominal and transvaginal ultrasound examinations of the pelvis were performed. Transabdominal technique was performed for global imaging of the pelvis including uterus, ovaries, adnexal regions, and pelvic cul-de-sac. It was necessary to proceed with endovaginal exam following the transabdominal exam to visualize the uterus, ovaries and endometrium.  COMPARISON: None  FINDINGS: Uterus  Measurements: 10.2 x 6.5 x 7.8 cm. Mildly heterogeneous myometrial echogenicity. Two hypoechoic nodules are seen which may represent small leiomyomata, anteriorly 2.6 x 2.8 x 3.4 cm and posteriorly 3.3 x 2.2 x 2.7 cm.  Endometrium  Thickness: 13 mm thick. Heterogeneous appearance with endometrial fluid and a slightly prominent hyperechoic focus centrally, unable to exclude endometrial mass/polyp.  Right ovary  Not definitely identified on either transabdominal or endovaginal imaging question related to obscuration by bowel.  Left ovary  Not identified on either transabdominal or endovaginal imaging question related to obscuration by bowel.  Other findings  No free-fluid or adnexal masses identified.  IMPRESSION: Question to a small uterine leiomyomata.  Heterogeneous endometrial complex containing fluid and question polyp versus mass.  Consider further evaluation with sonohysterogram for confirmation prior to hysteroscopy. Endometrial sampling should also be considered if patient is at high risk for endometrial carcinoma. (Ref: Radiological Reasoning: Algorithmic Workup of Abnormal Vaginal Bleeding with Endovaginal Sonography and Sonohysterography. AJR 2008; 671:I45-80)   Electronically Signed  By: Lavonia Dana M.D.  On: 12/06/2014 22:26 Assessment    Menometrorrhagia, fibroids     Plan    Megace 40 mg / day, consider Mirena, needs records, RTC 1 mo         Carmen Moore 12/31/2014, 3:15 PM

## 2014-12-31 NOTE — Patient Instructions (Signed)

## 2014-12-31 NOTE — Progress Notes (Signed)
Here visiting and having bleeding problems. States having irregular bleeding on/off for 23months. States it starts and stops - mostly light bleeding/spotting- not heavy bleeding. Most of the time is pink. Used Interpreter Laurena Spies. States about 04/2010 was having heavy bleeding and had D&C. After that the heavy bleeding stopped , then had regular periods until 9 months ago. Michela Pitcher went to doctor last year and said there was some damage to uterus- did not understand what the damage was.  Said went to [facility] point and they send her here.

## 2014-12-31 NOTE — Progress Notes (Signed)
ROI signed and faxed to [facility] (fax:[phone]; ph: [phone]) for patient records last fall.

## 2015-01-10 ENCOUNTER — Telehealth: Payer: Self-pay | Admitting: Family Medicine

## 2015-01-10 NOTE — Telephone Encounter (Signed)
Family physicians in [facility], called informing office that patient has not been to office since June 2015.

## 2015-01-31 ENCOUNTER — Encounter: Payer: Self-pay | Admitting: Obstetrics & Gynecology

## 2015-01-31 ENCOUNTER — Other Ambulatory Visit: Payer: Self-pay | Admitting: Obstetrics & Gynecology

## 2015-01-31 ENCOUNTER — Ambulatory Visit (INDEPENDENT_AMBULATORY_CARE_PROVIDER_SITE_OTHER): Payer: Medicare (Managed Care) | Admitting: Obstetrics & Gynecology

## 2015-01-31 VITALS — BP 165/82 | HR 82 | Temp 98.5°F | Ht 63.0 in | Wt 323.6 lb

## 2015-01-31 DIAGNOSIS — N921 Excessive and frequent menstruation with irregular cycle: Secondary | ICD-10-CM | POA: Diagnosis not present

## 2015-01-31 DIAGNOSIS — D259 Leiomyoma of uterus, unspecified: Secondary | ICD-10-CM | POA: Diagnosis not present

## 2015-01-31 MED ORDER — METOPROLOL TARTRATE 50 MG PO TABS: 50.0000 mg | ORAL_TABLET | Freq: Every day | ORAL | Status: DC

## 2015-01-31 MED ORDER — LISINOPRIL 10 MG PO TABS: 10.0000 mg | ORAL_TABLET | Freq: Every day | ORAL | Status: AC

## 2015-01-31 MED ORDER — LISINOPRIL 10 MG PO TABS: 10.0000 mg | ORAL_TABLET | Freq: Every day | ORAL | Status: DC

## 2015-01-31 MED ORDER — FAMOTIDINE 20 MG PO TABS: 20.0000 mg | ORAL_TABLET | Freq: Two times a day (BID) | ORAL | Status: DC

## 2015-01-31 MED ORDER — MEGESTROL ACETATE 20 MG PO TABS: 40.0000 mg | ORAL_TABLET | Freq: Every day | ORAL | Status: DC

## 2015-01-31 NOTE — Progress Notes (Signed)
Refilled patient's prescriptions, moving to [facility]  Woodroe Mode, MD 01/31/2015

## 2015-01-31 NOTE — Progress Notes (Signed)
Used interpreter Sherolyn Buba

## 2015-06-04 ENCOUNTER — Telehealth: Payer: Self-pay | Admitting: *Deleted

## 2015-06-04 NOTE — Telephone Encounter (Signed)
Pt called with aid from [employer] interpreter to request medication refill of Megace. She is currently having heavy bleeding and has no remaining refills from Rx given on 01/31/15. She has moved to [place] as stated in note from Dr. Roselie Awkward on that visit date. I advised pt that we are not able to refill the Rx as we cannot perform adequate evaluation of her current condition or response to the medication.  Pt was advised to go to an urgent care facility or ED for evaluation and treatment. She requested information about her previous Rx including name of medication and dosage as her Rx bottle has been discarded. I provided the information requested. Pt acknowledged understanding and agreed to recommendations given.

## 2015-06-27 ENCOUNTER — Other Ambulatory Visit: Payer: Self-pay | Admitting: Obstetrics & Gynecology

## 2015-08-06 ENCOUNTER — Telehealth: Payer: Self-pay | Admitting: *Deleted

## 2015-08-06 NOTE — Telephone Encounter (Signed)
Pt called and states that she is bleeding very heavy and having a lot of pain. She lives in [place] now. I advised that she seek medical attention at the nearest urgent care or emergency room. Also advised that she needs to establish medical care. Patient voiced understanding and had no further questions.

## 2017-10-19 DIAGNOSIS — I639 Cerebral infarction, unspecified: Secondary | ICD-10-CM

## 2017-10-19 HISTORY — DX: Cerebral infarction, unspecified: I63.9

## 2018-08-17 ENCOUNTER — Inpatient Hospital Stay (HOSPITAL_COMMUNITY)
Admission: EM | Admit: 2018-08-17 | Discharge: 2018-08-25 | DRG: 372 | Disposition: A | Discharge status: Home / Self Care | Payer: Medicare (Managed Care) | Attending: Surgery | Admitting: Surgery

## 2018-08-17 ENCOUNTER — Encounter (HOSPITAL_COMMUNITY): Payer: Self-pay

## 2018-08-17 ENCOUNTER — Emergency Department (HOSPITAL_COMMUNITY): Payer: Medicare (Managed Care)

## 2018-08-17 DIAGNOSIS — Z8673 Personal history of transient ischemic attack (TIA), and cerebral infarction without residual deficits: Secondary | ICD-10-CM

## 2018-08-17 DIAGNOSIS — Z8249 Family history of ischemic heart disease and other diseases of the circulatory system: Secondary | ICD-10-CM | POA: Diagnosis not present

## 2018-08-17 DIAGNOSIS — Z23 Encounter for immunization: Secondary | ICD-10-CM | POA: Diagnosis present

## 2018-08-17 DIAGNOSIS — Z91018 Allergy to other foods: Secondary | ICD-10-CM | POA: Diagnosis not present

## 2018-08-17 DIAGNOSIS — E119 Type 2 diabetes mellitus without complications: Secondary | ICD-10-CM | POA: Diagnosis present

## 2018-08-17 DIAGNOSIS — N898 Other specified noninflammatory disorders of vagina: Secondary | ICD-10-CM | POA: Diagnosis present

## 2018-08-17 DIAGNOSIS — Z888 Allergy status to other drugs, medicaments and biological substances status: Secondary | ICD-10-CM | POA: Diagnosis not present

## 2018-08-17 DIAGNOSIS — Z9861 Coronary angioplasty status: Secondary | ICD-10-CM

## 2018-08-17 DIAGNOSIS — Z79891 Long term (current) use of opiate analgesic: Secondary | ICD-10-CM

## 2018-08-17 DIAGNOSIS — Z791 Long term (current) use of non-steroidal anti-inflammatories (NSAID): Secondary | ICD-10-CM | POA: Diagnosis not present

## 2018-08-17 DIAGNOSIS — K3533 Acute appendicitis with perforation and localized peritonitis, with abscess: Principal | ICD-10-CM | POA: Diagnosis present

## 2018-08-17 DIAGNOSIS — Z6841 Body Mass Index (BMI) 40.0 and over, adult: Secondary | ICD-10-CM | POA: Diagnosis not present

## 2018-08-17 DIAGNOSIS — Z79899 Other long term (current) drug therapy: Secondary | ICD-10-CM | POA: Diagnosis not present

## 2018-08-17 DIAGNOSIS — K37 Unspecified appendicitis: Secondary | ICD-10-CM

## 2018-08-17 DIAGNOSIS — E785 Hyperlipidemia, unspecified: Secondary | ICD-10-CM | POA: Diagnosis present

## 2018-08-17 DIAGNOSIS — Z91040 Latex allergy status: Secondary | ICD-10-CM

## 2018-08-17 DIAGNOSIS — E876 Hypokalemia: Secondary | ICD-10-CM | POA: Diagnosis present

## 2018-08-17 DIAGNOSIS — J45909 Unspecified asthma, uncomplicated: Secondary | ICD-10-CM | POA: Diagnosis present

## 2018-08-17 DIAGNOSIS — Z7984 Long term (current) use of oral hypoglycemic drugs: Secondary | ICD-10-CM

## 2018-08-17 DIAGNOSIS — H9190 Unspecified hearing loss, unspecified ear: Secondary | ICD-10-CM | POA: Diagnosis present

## 2018-08-17 DIAGNOSIS — L0291 Cutaneous abscess, unspecified: Secondary | ICD-10-CM

## 2018-08-17 DIAGNOSIS — Z7902 Long term (current) use of antithrombotics/antiplatelets: Secondary | ICD-10-CM

## 2018-08-17 DIAGNOSIS — I2510 Atherosclerotic heart disease of native coronary artery without angina pectoris: Secondary | ICD-10-CM | POA: Diagnosis present

## 2018-08-17 DIAGNOSIS — K3532 Acute appendicitis with perforation and localized peritonitis, without abscess: Secondary | ICD-10-CM | POA: Diagnosis present

## 2018-08-17 DIAGNOSIS — B9562 Methicillin resistant Staphylococcus aureus infection as the cause of diseases classified elsewhere: Secondary | ICD-10-CM | POA: Diagnosis present

## 2018-08-17 DIAGNOSIS — I10 Essential (primary) hypertension: Secondary | ICD-10-CM | POA: Diagnosis present

## 2018-08-17 DIAGNOSIS — R109 Unspecified abdominal pain: Secondary | ICD-10-CM | POA: Diagnosis present

## 2018-08-17 HISTORY — DX: Other specified health status: Z78.9

## 2018-08-17 HISTORY — DX: Other complications of anesthesia, initial encounter: T88.59XA

## 2018-08-17 HISTORY — DX: Cerebral infarction, unspecified: I63.9

## 2018-08-17 HISTORY — DX: Adverse effect of unspecified anesthetic, initial encounter: T41.45XA

## 2018-08-17 LAB — I-STAT BETA HCG BLOOD, ED (MC, WL, AP ONLY): I-stat hCG, quantitative: <5 m[IU]/mL (ref <5)

## 2018-08-17 LAB — COMPREHENSIVE METABOLIC PANEL
ALT: 14 U/L (ref 0–44)
AST: 17 U/L (ref 15–41)
Albumin: 3.7 g/dL (ref 3.5–5.0)
Alkaline Phosphatase: 94 U/L (ref 38–126)
Anion gap: 10 (ref 5–15)
BUN: 8 mg/dL (ref 6–20)
CALCIUM: 9.3 mg/dL (ref 8.9–10.3)
CO2: 26 mmol/L (ref 22–32)
Chloride: 101 mmol/L (ref 98–111)
Creatinine, Ser: 0.77 mg/dL (ref 0.44–1.00)
GFR calc Af Amer: >60 mL/min (ref >60)
GFR calc non Af Amer: >60 mL/min (ref >60)
Glucose, Bld: 142 mg/dL — ABNORMAL HIGH (ref 70–99)
Potassium: 3.5 mmol/L (ref 3.5–5.1)
SODIUM: 137 mmol/L (ref 135–145)
Total Bilirubin: 0.6 mg/dL (ref 0.3–1.2)
Total Protein: 7.3 g/dL (ref 6.5–8.1)

## 2018-08-17 LAB — WET PREP, GENITAL
Clue Cells Wet Prep HPF POC: NONE SEEN
Sperm: NONE SEEN
Trich, Wet Prep: NONE SEEN
WBC, Wet Prep HPF POC: NONE SEEN
Yeast Wet Prep HPF POC: NONE SEEN

## 2018-08-17 LAB — CBC
HEMATOCRIT: 38.2 % (ref 36.0–46.0)
Hemoglobin: 11.6 g/dL — ABNORMAL LOW (ref 12.0–15.0)
MCH: 24.3 pg — ABNORMAL LOW (ref 26.0–34.0)
MCHC: 30.4 g/dL (ref 30.0–36.0)
MCV: 80.1 fL (ref 80.0–100.0)
NRBC: 0 % (ref 0.0–0.2)
PLATELETS: 249 10*3/uL (ref 150–400)
RBC: 4.77 MIL/uL (ref 3.87–5.11)
RDW: 15.6 % — ABNORMAL HIGH (ref 11.5–15.5)
WBC: 15.2 10*3/uL — ABNORMAL HIGH (ref 4.0–10.5)

## 2018-08-17 LAB — URINALYSIS, ROUTINE W REFLEX MICROSCOPIC
BILIRUBIN URINE: NEGATIVE
Glucose, UA: NEGATIVE mg/dL
HGB URINE DIPSTICK: NEGATIVE
Ketones, ur: NEGATIVE mg/dL
Nitrite: NEGATIVE
PH: 5 (ref 5.0–8.0)
PROTEIN: 100 mg/dL — AB
Specific Gravity, Urine: 1.02 (ref 1.005–1.030)

## 2018-08-17 LAB — LIPASE, BLOOD: Lipase: 23 U/L (ref 11–51)

## 2018-08-17 LAB — I-STAT CG4 LACTIC ACID, ED: LACTIC ACID, VENOUS: 1.21 mmol/L (ref 0.5–1.9)

## 2018-08-17 MED ORDER — LORATADINE 10 MG PO TABS: 10.0000 mg | ORAL_TABLET | Freq: Every day | ORAL | Status: DC | PRN

## 2018-08-17 MED ORDER — LEVALBUTEROL HCL 0.63 MG/3ML IN NEBU: 0.6300 mg | INHALATION_SOLUTION | Freq: Four times a day (QID) | RESPIRATORY_TRACT | Status: DC | PRN

## 2018-08-17 MED ORDER — POTASSIUM CHLORIDE IN NACL 20-0.9 MEQ/L-% IV SOLN
INTRAVENOUS | Status: DC
  Administered 2018-08-17 – 2018-08-21 (×7): via INTRAVENOUS
  Filled 2018-08-17 (×8): qty 1000

## 2018-08-17 MED ORDER — ENOXAPARIN SODIUM 40 MG/0.4ML ~~LOC~~ SOLN
40.0000 mg | SUBCUTANEOUS | Status: DC
  Administered 2018-08-18 – 2018-08-20 (×3): 40 mg via SUBCUTANEOUS
  Filled 2018-08-17 (×3): qty 0.4

## 2018-08-17 MED ORDER — IOHEXOL 300 MG/ML  SOLN
100.0000 mL | Freq: Once | INTRAMUSCULAR | Status: AC | PRN
  Administered 2018-08-17: 100 mL via INTRAVENOUS

## 2018-08-17 MED ORDER — BUSPIRONE HCL 15 MG PO TABS
7.5000 mg | ORAL_TABLET | Freq: Two times a day (BID) | ORAL | Status: DC
  Administered 2018-08-18 – 2018-08-25 (×15): 7.5 mg via ORAL
  Filled 2018-08-17 (×15): qty 1

## 2018-08-17 MED ORDER — PIPERACILLIN-TAZOBACTAM 3.375 G IVPB
3.3750 g | Freq: Three times a day (TID) | INTRAVENOUS | Status: DC
  Administered 2018-08-18: 3.375 g via INTRAVENOUS
  Filled 2018-08-17 (×2): qty 50

## 2018-08-17 MED ORDER — NEBIVOLOL HCL 10 MG PO TABS
20.0000 mg | ORAL_TABLET | Freq: Two times a day (BID) | ORAL | Status: DC
  Administered 2018-08-18 – 2018-08-25 (×15): 20 mg via ORAL
  Filled 2018-08-17 (×15): qty 2

## 2018-08-17 MED ORDER — METRONIDAZOLE IN NACL 5-0.79 MG/ML-% IV SOLN
500.0000 mg | Freq: Once | INTRAVENOUS | Status: AC
  Administered 2018-08-17: 500 mg via INTRAVENOUS
  Filled 2018-08-17: qty 100

## 2018-08-17 MED ORDER — HYDROMORPHONE HCL 1 MG/ML IJ SOLN
0.5000 mg | INTRAMUSCULAR | Status: DC | PRN
  Administered 2018-08-17 – 2018-08-24 (×6): 1 mg via INTRAVENOUS
  Filled 2018-08-17 (×6): qty 1

## 2018-08-17 MED ORDER — PIPERACILLIN-TAZOBACTAM 3.375 G IVPB
3.3750 g | Freq: Three times a day (TID) | INTRAVENOUS | Status: DC
  Administered 2018-08-18: 3.375 g via INTRAVENOUS
  Filled 2018-08-17: qty 50

## 2018-08-17 MED ORDER — PROCHLORPERAZINE MALEATE 10 MG PO TABS
10.0000 mg | ORAL_TABLET | Freq: Four times a day (QID) | ORAL | Status: DC | PRN
  Filled 2018-08-17: qty 1

## 2018-08-17 MED ORDER — ACETAMINOPHEN 325 MG PO TABS
650.0000 mg | ORAL_TABLET | Freq: Once | ORAL | Status: AC
  Administered 2018-08-17: 650 mg via ORAL
  Filled 2018-08-17: qty 2

## 2018-08-17 MED ORDER — CITALOPRAM HYDROBROMIDE 20 MG PO TABS
10.0000 mg | ORAL_TABLET | Freq: Every day | ORAL | Status: DC
  Administered 2018-08-18 – 2018-08-25 (×8): 10 mg via ORAL
  Filled 2018-08-17 (×8): qty 1

## 2018-08-17 MED ORDER — SODIUM CHLORIDE 0.9 % IV BOLUS
1000.0000 mL | Freq: Once | INTRAVENOUS | Status: AC
  Administered 2018-08-17: 1000 mL via INTRAVENOUS

## 2018-08-17 MED ORDER — PROCHLORPERAZINE EDISYLATE 10 MG/2ML IJ SOLN: 5.0000 mg | Freq: Four times a day (QID) | INTRAMUSCULAR | Status: DC | PRN

## 2018-08-17 MED ORDER — ONDANSETRON 4 MG PO TBDP: 4.0000 mg | ORAL_TABLET | Freq: Four times a day (QID) | ORAL | Status: DC | PRN

## 2018-08-17 MED ORDER — TRAZODONE HCL 50 MG PO TABS
50.0000 mg | ORAL_TABLET | Freq: Every day | ORAL | Status: DC
  Administered 2018-08-18 – 2018-08-24 (×7): 50 mg via ORAL
  Filled 2018-08-17 (×7): qty 1

## 2018-08-17 MED ORDER — PIPERACILLIN-TAZOBACTAM 3.375 G IVPB 30 MIN
3.3750 g | Freq: Once | INTRAVENOUS | Status: AC
  Administered 2018-08-17: 3.375 g via INTRAVENOUS
  Filled 2018-08-17: qty 50

## 2018-08-17 MED ORDER — LISINOPRIL 10 MG PO TABS
10.0000 mg | ORAL_TABLET | Freq: Every day | ORAL | Status: DC
  Administered 2018-08-18 – 2018-08-25 (×8): 10 mg via ORAL
  Filled 2018-08-17 (×8): qty 1

## 2018-08-17 MED ORDER — LEVALBUTEROL TARTRATE 45 MCG/ACT IN AERO: 2.0000 | INHALATION_SPRAY | Freq: Four times a day (QID) | RESPIRATORY_TRACT | Status: DC | PRN

## 2018-08-17 MED ORDER — ONDANSETRON HCL 4 MG/2ML IJ SOLN: 4.0000 mg | Freq: Four times a day (QID) | INTRAMUSCULAR | Status: DC | PRN

## 2018-08-17 MED ORDER — MORPHINE SULFATE (PF) 4 MG/ML IV SOLN
4.0000 mg | Freq: Once | INTRAVENOUS | Status: AC
  Administered 2018-08-17: 4 mg via INTRAVENOUS
  Filled 2018-08-17: qty 1

## 2018-08-17 NOTE — ED Provider Notes (Signed)
[facility] EMERGENCY DEPARTMENT Provider Note   CSN: [id number] Arrival date & time: 08/17/18  1443     History   Chief Complaint Chief Complaint  Patient presents with  . Urinary Tract Infection    HPI Carmen Moore is a 52 y.o. female.  HPI   52 year old female presents today with complaints of abdominal pain.  Patient notes symptoms started on Tuesday.  She describes this as a burning sensation in her lower pelvis and bladder, notes frequent urination and pain with urination.  Patient originally denied any vaginal discharge, but on repeat questioning she reports she has had purulent vaginal discharge.  She notes she is not sexually active and has not been since her teens.  She reports feeling hot, she reports pain in her upper abdomen as well, although not as severe as the lower.  She notes a generalized headache with no neurological deficits.  She notes nausea but no vomiting.     Past Medical History:  Diagnosis Date  . Asthma   . CAD S/P percutaneous coronary angioplasty: PROBABLE PER PT DESCRIPTION OF CATH 2006 10/10/2013  . Deaf   . Diabetes mellitus without complication ([place])   . Fibroids   . HTN (hypertension) 10/10/2013  . Hyperlipidemia   . Hypertension   . Obesity     Patient Active Problem List   Diagnosis Date Noted  . Ruptured appendicitis 08/17/2018  . Menometrorrhagia 12/31/2014  . Fibroid 12/31/2014  . Normal coronary arteries 10/20/2013  . Morbid obesity ([place]) 10/20/2013  . Deafness 10/20/2013  . Chest pain 10/10/2013  . HTN (hypertension) 10/10/2013  . Hyperlipidemia 10/10/2013  . Diabetes mellitus ([place]) 10/10/2013  . Asthma 10/10/2013    Past Surgical History:  Procedure Laterality Date  . CARDIAC CATHETERIZATION    . DILATION AND CURETTAGE OF UTERUS    . HERNIA REPAIR    . LEFT HEART CATHETERIZATION WITH CORONARY ANGIOGRAM N/A 10/11/2013   Procedure: LEFT HEART CATHETERIZATION WITH CORONARY ANGIOGRAM;  Surgeon:  Lorretta Harp, MD;  Location: [facility] CATH LAB;  Service: Cardiovascular;  Laterality: N/A;     OB History    Gravida  1   Para      Term      Preterm      AB  1   Living        SAB  1   TAB      Ectopic      Multiple      Live Births               Home Medications    Prior to Admission medications   Medication Sig Start Date End Date Taking? Authorizing Provider  busPIRone (BUSPAR) 7.5 MG tablet Take 7.5 mg by mouth 2 (two) times daily.   Yes [provider]  citalopram (CELEXA) 10 MG tablet Take 10 mg by mouth daily.   Yes [provider]  clopidogrel (PLAVIX) 75 MG tablet Take 75 mg by mouth daily.   Yes [provider]  famotidine (PEPCID) 20 MG tablet Take 1 tablet (20 mg total) by mouth 2 (two) times daily. Patient taking differently: Take 20 mg by mouth daily.  01/31/15  Yes Woodroe Mode, MD  ferrous sulfate 325 (65 FE) MG tablet Take 325 mg by mouth daily with breakfast.   Yes [provider]  lisinopril (PRINIVIL,ZESTRIL) 10 MG tablet Take 1 tablet (10 mg total) by mouth daily. 01/31/15  Yes Wood[place] Mode, MD  loratadine (  CLARITIN) 10 MG tablet Take 10 mg by mouth daily as needed for allergies.   Yes [provider]  metFORMIN (GLUCOPHAGE) 500 MG tablet Take 500 mg by mouth 2 (two) times daily with a meal.   Yes [provider]  Nebivolol HCl (BYSTOLIC) 20 MG TABS Take 20 mg by mouth 2 (two) times daily.   Yes [provider]  traZODone (DESYREL) 50 MG tablet Take 50 mg by mouth at bedtime.   Yes [provider]  HYDROcodone-acetaminophen (NORCO/VICODIN) 5-325 MG per tablet Take 1-2 tablets by mouth every 4 (four) hours as needed. Patient not taking: Reported on 08/17/2018 12/06/14   Ashley Murrain, NP  levalbuterol [facility] HFA) 45 MCG/ACT inhaler Inhale 2 puffs into the lungs every 6 (six) hours as needed for wheezing. 10/20/13   Erlene Quan, PA-C  megestrol (MEGACE) 20 MG tablet TAKE  2 TABLETS BY MOUTH DAILY Patient not taking: Reported on 08/17/2018 06/28/15   [place] Mode, MD  metoprolol (LOPRESSOR) 50 MG tablet TAKE 1 TABLET BY MOUTH DAILY Patient not taking: Reported on 08/17/2018 02/01/15   [place] Mode, MD  naproxen (NAPROSYN) 500 MG tablet Take 1 tablet (500 mg total) by mouth 2 (two) times daily. Patient not taking: Reported on 12/31/2014 12/06/14   Ashley Murrain, NP  ondansetron (ZOFRAN) 4 MG tablet Take 1 tablet (4 mg total) by mouth every 6 (six) hours. Patient not taking: Reported on 12/31/2014 12/06/14   Ashley Murrain, NP    Family History Family History  Problem Relation Age of Onset  . Hypertension Mother     Social History Social History   Tobacco Use  . Smoking status: Never Smoker  . Smokeless tobacco: Never Used  Substance Use Topics  . Alcohol use: No  . Drug use: No     Allergies   Tomato; Beef-derived products; Nitroglycerin; Pollen extract; and Latex   Review of Systems Review of Systems  All other systems reviewed and are negative.    Physical Exam Updated Vital Signs BP 133/73 (BP Location: Left Arm)   Pulse 91   Temp (!) 102.8 F (39.3 C) (Oral)   Resp 18   SpO2 97%   Physical Exam  Constitutional: She is oriented to person, place, and time. She appears well-developed and well-nourished.  HENT:  Head: Normocephalic and atraumatic.  Eyes: Pupils are equal, round, and reactive to light. Conjunctivae are normal. Right eye exhibits no discharge. Left eye exhibits no discharge. No scleral icterus.  Neck: Normal range of motion. No JVD present. No tracheal deviation present.  Pulmonary/Chest: Effort normal. No stridor.  Abdominal:  Exquisite tenderness to palpation of the abdomen, worse in the right lower mid abdomen  Genitourinary:  Genitourinary Comments: Pelvic exam uncomfortable although nonfocal, no vaginal discharge noted  Neurological: She is alert and oriented to person, place, and time. Coordination normal.    Psychiatric: She has a normal mood and affect. Her behavior is normal. Judgment and thought content normal.  Nursing note and vitals reviewed.    ED Treatments / Results  Labs (all labs ordered are listed, but only abnormal results are displayed) Labs Reviewed  COMPREHENSIVE METABOLIC PANEL - Abnormal; Notable for the following components:      Result Value   Glucose, Bld 142 (*)    All other components within normal limits  CBC - Abnormal; Notable for the following components:   WBC 15.2 (*)    Hemoglobin 11.6 (*)    MCH 24.3 (*)  RDW 15.6 (*)    All other components within normal limits  URINALYSIS, ROUTINE W REFLEX MICROSCOPIC - Abnormal; Notable for the following components:   APPearance HAZY (*)    Protein, ur 100 (*)    Leukocytes, UA SMALL (*)    Bacteria, UA RARE (*)    All other components within normal limits  WET PREP, GENITAL  CULTURE, BLOOD (ROUTINE X 2)  CULTURE, BLOOD (ROUTINE X 2)  LIPASE, BLOOD  I-STAT BETA HCG BLOOD, ED ([facility], [facility], [facility] ONLY)  I-STAT CG4 LACTIC ACID, ED  GC/CHLAMYDIA PROBE AMP ([place]) NOT AT [facility]    EKG None  Radiology Ct Abdomen Pelvis W Contrast  Result Date: 08/17/2018 CLINICAL DATA:  Unspecified abdominal pain EXAM: CT ABDOMEN AND PELVIS WITH CONTRAST TECHNIQUE: Multidetector CT imaging of the abdomen and pelvis was performed using the standard protocol following bolus administration of intravenous contrast. CONTRAST:  177mL OMNIPAQUE IOHEXOL 300 MG/ML  SOLN COMPARISON:  None. FINDINGS: Lower chest:  No contributory findings. Hepatobiliary: No focal liver abnormality.No evidence of biliary obstruction or stone. Pancreas: Unremarkable. Spleen: Unremarkable. Adrenals/Urinary Tract: Negative adrenals. No hydronephrosis or ureteral stone. Punctate left lower pole calculus. Small calcification along the urethra without evidence of neighboring low-density to suggest underlying urethral diverticulum. Unremarkable bladder. Stomach/Bowel:  Inflammatory process in the low ventral pelvis with a tubular low-density structure with enhancing wall that does not clearly connect to any bowel loops. The collection is draped over the fundus of the uterus and the bilateral ovaries are indistinct. Vascular/Lymphatic: No acute vascular abnormality. No mass or adenopathy. Small incisional hernia containing fat and a loop of normal appearing small bowel. Reproductive:Inflammation about the adnexa as noted above. Other: No ascites or pneumoperitoneum. Musculoskeletal: No acute abnormalities. IMPRESSION: 1. Inflammation in the ventral pelvis from uncertain source. There is a tubular structure central to the inflammation, which is adjacent to the cecum, such that appendicitis is a strong consideration and would likely be complicated by perforation. There is no appendicolith. The proximity to the uterus and indistinct adnexa also raises the possibility of PID with pyosalpinx. 2. Small left renal calculus. 3. Punctate stone along the urethra but no discrete urethral diverticulum. Electronically Signed   By: Monte Fantasia M.D.   On: 08/17/2018 18:59    Procedures Procedures (including critical care time)  Medications Ordered in ED Medications  metroNIDAZOLE (FLAGYL) IVPB 500 mg (500 mg Intravenous New Bag/Given 08/17/18 2113)  piperacillin-tazobactam (ZOSYN) IVPB 3.375 g (has no administration in time range)  sodium chloride 0.9 % bolus 1,000 mL (1,000 mLs Intravenous New Bag/Given 08/17/18 2115)  morphine 4 MG/ML injection 4 mg (4 mg Intravenous Given 08/17/18 2033)  iohexol (OMNIPAQUE) 300 MG/ML solution 100 mL (100 mLs Intravenous Contrast Given 08/17/18 1823)  piperacillin-tazobactam (ZOSYN) IVPB 3.375 g (0 g Intravenous Stopped 08/17/18 2112)  acetaminophen (TYLENOL) tablet 650 mg (650 mg Oral Given 08/17/18 2201)     Initial Impression / Assessment and Plan / ED Course  I have reviewed the triage vital signs and the nursing notes.  Pertinent  labs & imaging results that were available during my care of the patient were reviewed by me and considered in my medical decision making (see chart for details).     Labs: CBC, CMP, lipase  Imaging: CT abdomen and pelvis with contrast  Consults: Dr. Hulen Skains  Therapeutics: Zosyn, metronidazole  Discharge Meds:   Assessment/Plan: 52 year old female presents today with complaints of abdominal pain.  I have high suspicion for acute intra-abdominal pathology.  Her CT scan shows undetermined source of inflammation with questionable appendicitis versus pelvic inflammatory disease.  Given patient's exam without purulent discharge, or cervical motion tenderness symptoms more consistent with intra-abdominal versus reproductive pelvic nature.  General surgery consulted, Dr. Hulen Skains will admit for ongoing evaluation management.   Final Clinical Impressions(s) / ED Diagnoses   Final diagnoses:  Appendicitis, unspecified appendicitis type    ED Discharge Orders    None       Okey Regal, PA-C 08/17/18 2205    Pattricia Boss, MD 08/19/18 1429

## 2018-08-17 NOTE — Progress Notes (Signed)
Pharmacy Antibiotic Note  Carmen Moore is a 52 y.o. female admitted on 08/17/2018 with possible ruptured appendicitis. Pharmacy has been consulted for Zosyn dosing. WBC - 15.2, afebrile; Scr-0.77CT reveals inflammation in ventral pelvic from uncertain source; appendicitis is a strong consideration and would likely be complicated by perforation. Proximity to uterus and indistinct adnexa also raises possibility of PID with pyosalpinx.   Plan: Zosyn 3.375g IV q8h Monitor and adjust per renal fx, clinical status, C&S     Temp (24hrs), Avg:98.9 F (37.2 C), Min:98.9 F (37.2 C), Max:98.9 F (37.2 C)  Recent Labs  Lab 08/17/18 1511  WBC 15.2*  CREATININE 0.77    CrCl cannot be calculated (Unknown ideal weight.).    Allergies  Allergen Reactions  . Tomato   . Beef-Derived Products Diarrhea  . Nitroglycerin Itching  . Pollen Extract   . Latex Rash    Antimicrobials this admission: Flagyl 10/30 x1  Zosyn 10/30 >>   Dose adjustments this admission: N/A  Microbiology results: Pending  Thank you for allowing pharmacy to be a part of this patient's care.  Tyson Babinski 08/17/2018 8:07 PM

## 2018-08-17 NOTE — H&P (Signed)
Carmen Moore is an 52 y.o. female.   Chief Complaint: Abdominal pain HPI: Started two days ago with frequency with urination and burning.  Progressed to having abdominal pain diffusely along the lower part of the abdomen  Past Medical History:  Diagnosis Date  . Asthma   . CAD S/P percutaneous coronary angioplasty: PROBABLE PER PT DESCRIPTION OF CATH 2006 10/10/2013  . Deaf   . Diabetes mellitus without complication ([place])   . Fibroids   . HTN (hypertension) 10/10/2013  . Hyperlipidemia   . Hypertension   . Obesity     Past Surgical History:  Procedure Laterality Date  . CARDIAC CATHETERIZATION    . DILATION AND CURETTAGE OF UTERUS    . HERNIA REPAIR    . LEFT HEART CATHETERIZATION WITH CORONARY ANGIOGRAM N/A 10/11/2013   Procedure: LEFT HEART CATHETERIZATION WITH CORONARY ANGIOGRAM;  Surgeon: Lorretta Harp, MD;  Location: [facility] CATH LAB;  Service: Cardiovascular;  Laterality: N/A;    Family History  Problem Relation Age of Onset  . Hypertension Mother    Social History:  reports that she has never smoked. She has never used smokeless tobacco. She reports that she does not drink alcohol or use drugs.  Allergies:  Allergies  Allergen Reactions  . Tomato   . Beef-Derived Products Diarrhea  . Nitroglycerin Itching  . Pollen Extract   . Latex Rash     (Not in a hospital admission)  Results for orders placed or performed during the hospital encounter of 08/17/18 (from the past 48 hour(s))  Lipase, blood     Status: None   Collection Time: 08/17/18  3:11 PM  Result Value Ref Range   Lipase 23 11 - 51 U/L    Comment: Performed at [facility], [place] [address] [place], [place] [address]  Comprehensive metabolic panel     Status: Abnormal   Collection Time: 08/17/18  3:11 PM  Result Value Ref Range   Sodium 137 135 - 145 mmol/L   Potassium 3.5 3.5 - 5.1 mmol/L   Chloride 101 98 - 111 mmol/L   CO2 26 22 - 32 mmol/L   Glucose, Bld 142 (H) 70 - 99 mg/dL   BUN 8 6 -  20 mg/dL   Creatinine, Ser 0.77 0.44 - 1.00 mg/dL   Calcium 9.3 8.9 - 10.3 mg/dL   Total Protein 7.3 6.5 - 8.1 g/dL   Albumin 3.7 3.5 - 5.0 g/dL   AST 17 15 - 41 U/L   ALT 14 0 - 44 U/L   Alkaline Phosphatase 94 38 - 126 U/L   Total Bilirubin 0.6 0.3 - 1.2 mg/dL   GFR calc non Af Amer >60 >60 mL/min   GFR calc Af Amer >60 >60 mL/min    Comment: (NOTE) The eGFR has been calculated using the CKD EPI equation. This calculation has not been validated in all clinical situations. eGFR's persistently <60 mL/min signify possible Chronic Kidney Disease.    Anion gap 10 5 - 15    Comment: Performed at [facility] [address] [place] [address]  CBC     Status: Abnormal   Collection Time: 08/17/18  3:11 PM  Result Value Ref Range   WBC 15.2 (H) 4.0 - 10.5 K/uL   RBC 4.77 3.87 - 5.11 MIL/uL   Hemoglobin 11.6 (L) 12.0 - 15.0 g/dL   HCT 38.2 36.0 - 46.0 %   MCV 80.1 80.0 - 100.0 fL   MCH 24.3 (L)  26.0 - 34.0 pg   MCHC 30.4 30.0 - 36.0 g/dL   RDW 15.6 (H) 11.5 - 15.5 %   Platelets 249 150 - 400 K/uL   nRBC 0.0 0.0 - 0.2 %    Comment: Performed at [facility], [place] [address], [place], [place] [address]  I-Stat beta hCG blood, ED     Status: None   Collection Time: 08/17/18  3:26 PM  Result Value Ref Range   I-stat hCG, quantitative <5.0 <5 mIU/mL   Comment 3            Comment:   GEST. AGE      CONC.  (mIU/mL)   <=1 WEEK        5 - 50     2 WEEKS       50 - 500     3 WEEKS       100 - 10,000     4 WEEKS     1,000 - 30,000        FEMALE AND NON-PREGNANT FEMALE:     LESS THAN 5 mIU/mL   Urinalysis, Routine w reflex microscopic     Status: Abnormal   Collection Time: 08/17/18  4:54 PM  Result Value Ref Range   Color, Urine YELLOW YELLOW   APPearance HAZY (A) CLEAR   Specific Gravity, Urine 1.020 1.005 - 1.030   pH 5.0 5.0 - 8.0   Glucose, UA NEGATIVE NEGATIVE mg/dL   Hgb urine dipstick NEGATIVE NEGATIVE   Bilirubin Urine NEGATIVE NEGATIVE   Ketones, ur  NEGATIVE NEGATIVE mg/dL   Protein, ur 100 (A) NEGATIVE mg/dL   Nitrite NEGATIVE NEGATIVE   Leukocytes, UA SMALL (A) NEGATIVE   RBC / HPF 0-5 0 - 5 RBC/hpf   WBC, UA 0-5 0 - 5 WBC/hpf   Bacteria, UA RARE (A) NONE SEEN   Squamous Epithelial / LPF 0-5 0 - 5   Mucus PRESENT    Hyaline Casts, UA PRESENT     Comment: Performed at [facility], [address], [place], [place] [address]  Wet prep, genital     Status: None   Collection Time: 08/17/18  7:45 PM  Result Value Ref Range   Yeast Wet Prep HPF POC NONE SEEN NONE SEEN   Trich, Wet Prep NONE SEEN NONE SEEN   Clue Cells Wet Prep HPF POC NONE SEEN NONE SEEN   WBC, Wet Prep HPF POC NONE SEEN NONE SEEN   Sperm NONE SEEN     Comment: Performed at [facility], [place] [address] [place], [place] [address]  I-Stat CG4 Lactic Acid, ED     Status: None   Collection Time: 08/17/18  8:46 PM  Result Value Ref Range   Lactic Acid, Venous 1.21 0.5 - 1.9 mmol/L   Ct Abdomen Pelvis W Contrast  Result Date: 08/17/2018 CLINICAL DATA:  Unspecified abdominal pain EXAM: CT ABDOMEN AND PELVIS WITH CONTRAST TECHNIQUE: Multidetector CT imaging of the abdomen and pelvis was performed using the standard protocol following bolus administration of intravenous contrast. CONTRAST:  154m OMNIPAQUE IOHEXOL 300 MG/ML  SOLN COMPARISON:  None. FINDINGS: Lower chest:  No contributory findings. Hepatobiliary: No focal liver abnormality.No evidence of biliary obstruction or stone. Pancreas: Unremarkable. Spleen: Unremarkable. Adrenals/Urinary Tract: Negative adrenals. No hydronephrosis or ureteral stone. Punctate left lower pole calculus. Small calcification along the urethra without evidence of neighboring low-density to suggest underlying urethral diverticulum. Unremarkable bladder. Stomach/Bowel: Inflammatory process in the low ventral  pelvis with a tubular low-density structure with enhancing wall that does not clearly connect to any bowel loops. The collection  is draped over the fundus of the uterus and the bilateral ovaries are indistinct. Vascular/Lymphatic: No acute vascular abnormality. No mass or adenopathy. Small incisional hernia containing fat and a loop of normal appearing small bowel. Reproductive:Inflammation about the adnexa as noted above. Other: No ascites or pneumoperitoneum. Musculoskeletal: No acute abnormalities. IMPRESSION: 1. Inflammation in the ventral pelvis from uncertain source. There is a tubular structure central to the inflammation, which is adjacent to the cecum, such that appendicitis is a strong consideration and would likely be complicated by perforation. There is no appendicolith. The proximity to the uterus and indistinct adnexa also raises the possibility of PID with pyosalpinx. 2. Small left renal calculus. 3. Punctate stone along the urethra but no discrete urethral diverticulum. Electronically Signed   By: Monte Fantasia M.D.   On: 08/17/2018 18:59    Review of Systems  Constitutional: Positive for chills and fever.  HENT: Positive for hearing loss (deaf).   Respiratory: Negative.   Cardiovascular: Negative.   Gastrointestinal: Positive for abdominal pain.  Genitourinary: Positive for dysuria, frequency and urgency.    Blood pressure 133/73, pulse 91, temperature (!) 102.8 F (39.3 C), temperature source Oral, resp. rate 18, SpO2 97 %. Physical Exam  Vitals reviewed. Constitutional: She is oriented to person, place, and time. She appears well-developed.  Obese  HENT:  Head: Normocephalic and atraumatic.  Eyes: Pupils are equal, round, and reactive to light. Conjunctivae and EOM are normal.  Neck: Normal range of motion.  Cardiovascular: Normal rate, regular rhythm and normal heart sounds.  Respiratory: Effort normal and breath sounds normal.  GI: Soft. Bowel sounds are normal. There is tenderness in the right lower quadrant and left lower quadrant. There is rebound, guarding and CVA tenderness. There is no  rigidity.    Musculoskeletal: Normal range of motion.  Neurological: She is alert and oriented to person, place, and time.  Psychiatric: She has a normal mood and affect.  Patient is completely deaf and does not read lips well     Assessment/Plan Acute appendicitis with rupture and a phlegmon COncern for PID, bnut her wet prep is negative and she has not had sex recently  Will admit and place the patient on IV antibiotics/Zosyn .  She is visiting  Here from [place], and Only other medical conditions are DM, hypertension, and recent CVA for which she takes Plavix.  Will hold the plavix   Judeth Horn, MD 08/17/2018, 9:49 PM

## 2018-08-17 NOTE — ED Notes (Signed)
Assessment of pt using the interpreter.  Pt  C/o generalized abd pain, nausea, headache, feeling dizzy, constipation, urinary frequency, vaginal pain and unable to eat.  Pt has multiple complaints.  All symptoms started yesterday

## 2018-08-17 NOTE — ED Notes (Signed)
Pt is a Jehovah witness and wants to be reassured she will not receive any blood.

## 2018-08-17 NOTE — ED Triage Notes (Signed)
Pt arrives by [facility]- pt is deaf- interpretor used, pt reports lower abd pain with frequent urination. Pt also reports pain in vagina with discharge.

## 2018-08-17 NOTE — ED Notes (Signed)
All pt charting thus far done by Viann Fish, RN

## 2018-08-17 NOTE — ED Notes (Signed)
Pt to CT at this tine

## 2018-08-18 ENCOUNTER — Other Ambulatory Visit: Payer: Self-pay

## 2018-08-18 ENCOUNTER — Encounter (HOSPITAL_COMMUNITY): Payer: Self-pay | Admitting: General Practice

## 2018-08-18 LAB — BLOOD CULTURE ID PANEL (REFLEXED)

## 2018-08-18 LAB — BASIC METABOLIC PANEL
ANION GAP: 4 — AB (ref 5–15)
BUN: 8 mg/dL (ref 6–20)
CHLORIDE: 106 mmol/L (ref 98–111)
CO2: 26 mmol/L (ref 22–32)
Calcium: 8.4 mg/dL — ABNORMAL LOW (ref 8.9–10.3)
Creatinine, Ser: 0.77 mg/dL (ref 0.44–1.00)
GFR calc non Af Amer: >60 mL/min (ref >60)
Glucose, Bld: 129 mg/dL — ABNORMAL HIGH (ref 70–99)
POTASSIUM: 3.7 mmol/L (ref 3.5–5.1)
Sodium: 136 mmol/L (ref 135–145)

## 2018-08-18 LAB — CBC
HEMATOCRIT: 34.2 % — AB (ref 36.0–46.0)
HEMOGLOBIN: 10.4 g/dL — AB (ref 12.0–15.0)
MCH: 24.2 pg — ABNORMAL LOW (ref 26.0–34.0)
MCHC: 30.4 g/dL (ref 30.0–36.0)
MCV: 79.5 fL — ABNORMAL LOW (ref 80.0–100.0)
NRBC: 0 % (ref 0.0–0.2)
PLATELETS: 205 10*3/uL (ref 150–400)
RBC: 4.3 MIL/uL (ref 3.87–5.11)
RDW: 16 % — ABNORMAL HIGH (ref 11.5–15.5)
WBC: 15.5 10*3/uL — ABNORMAL HIGH (ref 4.0–10.5)

## 2018-08-18 LAB — GC/CHLAMYDIA PROBE AMP (~~LOC~~) NOT AT ARMC
Chlamydia: NEGATIVE
Neisseria Gonorrhea: NEGATIVE

## 2018-08-18 LAB — HIV ANTIBODY (ROUTINE TESTING W REFLEX): HIV Screen 4th Generation wRfx: NONREACTIVE

## 2018-08-18 MED ORDER — PIPERACILLIN-TAZOBACTAM 3.375 G IVPB
3.3750 g | Freq: Three times a day (TID) | INTRAVENOUS | Status: DC
  Administered 2018-08-18 – 2018-08-25 (×20): 3.375 g via INTRAVENOUS
  Filled 2018-08-18 (×15): qty 50

## 2018-08-18 MED ORDER — ORAL CARE MOUTH RINSE
15.0000 mL | Freq: Two times a day (BID) | OROMUCOSAL | Status: DC
  Administered 2018-08-18 – 2018-08-24 (×7): 15 mL via OROMUCOSAL

## 2018-08-18 MED ORDER — ACETAMINOPHEN 500 MG PO TABS
1000.0000 mg | ORAL_TABLET | Freq: Four times a day (QID) | ORAL | Status: DC
  Administered 2018-08-18 – 2018-08-25 (×25): 1000 mg via ORAL
  Filled 2018-08-18 (×28): qty 2

## 2018-08-18 MED ORDER — OXYCODONE HCL 5 MG PO TABS
5.0000 mg | ORAL_TABLET | ORAL | Status: DC | PRN
  Administered 2018-08-18 – 2018-08-23 (×8): 10 mg via ORAL
  Filled 2018-08-18 (×8): qty 2

## 2018-08-18 MED ORDER — KETOROLAC TROMETHAMINE 30 MG/ML IJ SOLN
30.0000 mg | Freq: Three times a day (TID) | INTRAMUSCULAR | Status: AC
  Administered 2018-08-18 – 2018-08-21 (×9): 30 mg via INTRAVENOUS
  Filled 2018-08-18 (×9): qty 1

## 2018-08-18 MED ORDER — INFLUENZA VAC SPLIT QUAD 0.5 ML IM SUSY
0.5000 mL | PREFILLED_SYRINGE | INTRAMUSCULAR | Status: AC
  Administered 2018-08-19: 0.5 mL via INTRAMUSCULAR
  Filled 2018-08-18: qty 0.5

## 2018-08-18 MED ORDER — PNEUMOCOCCAL VAC POLYVALENT 25 MCG/0.5ML IJ INJ
0.5000 mL | INJECTION | INTRAMUSCULAR | Status: AC
  Administered 2018-08-19: 0.5 mL via INTRAMUSCULAR
  Filled 2018-08-18: qty 0.5

## 2018-08-18 NOTE — Progress Notes (Signed)
PHARMACY - PHYSICIAN COMMUNICATION CRITICAL VALUE ALERT - BLOOD CULTURE IDENTIFICATION (BCID)  Carmen Moore is an 52 y.o. female who presented to [facility] on 08/17/2018   Assessment:  RLQ pain with acute appendicitis with rupture.  Name of physician (or Provider) Contacted: N/A  Current antibiotics: Zosyn  Changes to prescribed antibiotics recommended:  None  Results for orders placed or performed during the hospital encounter of 08/17/18  Blood Culture ID Panel (Reflexed) (Collected: 08/17/2018  8:32 PM)  Result Value Ref Range   Enterococcus species NOT DETECTED NOT DETECTED   Listeria monocytogenes NOT DETECTED NOT DETECTED   Staphylococcus species DETECTED (A) NOT DETECTED   Staphylococcus aureus (BCID) NOT DETECTED NOT DETECTED   Methicillin resistance DETECTED (A) NOT DETECTED   Streptococcus species NOT DETECTED NOT DETECTED   Streptococcus agalactiae NOT DETECTED NOT DETECTED   Streptococcus pneumoniae NOT DETECTED NOT DETECTED   Streptococcus pyogenes NOT DETECTED NOT DETECTED   Acinetobacter baumannii NOT DETECTED NOT DETECTED   Enterobacteriaceae species NOT DETECTED NOT DETECTED   Enterobacter cloacae complex NOT DETECTED NOT DETECTED   Escherichia coli NOT DETECTED NOT DETECTED   Klebsiella oxytoca NOT DETECTED NOT DETECTED   Klebsiella pneumoniae NOT DETECTED NOT DETECTED   Proteus species NOT DETECTED NOT DETECTED   Serratia marcescens NOT DETECTED NOT DETECTED   Haemophilus influenzae NOT DETECTED NOT DETECTED   Neisseria meningitidis NOT DETECTED NOT DETECTED   Pseudomonas aeruginosa NOT DETECTED NOT DETECTED   Candida albicans NOT DETECTED NOT DETECTED   Candida glabrata NOT DETECTED NOT DETECTED   Candida krusei NOT DETECTED NOT DETECTED   Candida parapsilosis NOT DETECTED NOT DETECTED   Candida tropicalis NOT DETECTED NOT DETECTED    [name] 08/18/2018  5:44 PM

## 2018-08-18 NOTE — Progress Notes (Signed)
Patient admitted to room 6N30 from ED. Alert and oriented x4, deaf. Pain 8/10. Oriented to room and call bell.

## 2018-08-18 NOTE — Plan of Care (Signed)

## 2018-08-18 NOTE — Plan of Care (Signed)
  Problem: Nutrition: Goal: Adequate nutrition will be maintained Outcome: Progressing   Problem: Pain Managment: Goal: General experience of comfort will improve Outcome: Progressing   

## 2018-08-18 NOTE — Progress Notes (Addendum)
[facility] Progress Note     Subjective: CC:  Interpreter used. Pt reports severe RLQ worse with movement, not improved compared to yesterday. Denies BM in 2 days, at baseline has a formed stool daily. Denies vomiting. Patient is a Neurosurgeon witness.  We discussed the treatment of perforated appendicitis in detail including the increased risks surrounding surgery, possible drain placement, possible prolonged hospital stay, and follow up with a surgeon in [place] to discuss if interval appendectomy is recommended.  Objective: Vital signs in last 24 hours: Temp:  [98.9 F (37.2 C)-102.8 F (39.3 C)] 99.7 F (37.6 C) (10/31 4401) Pulse Rate:  [60-91] 88 (10/31 0608) Resp:  [16-18] 16 (10/31 0272) BP: (100-138)/(60-80) 134/80 (10/31 0608) SpO2:  [97 %-99 %] 98 % (10/31 5366) Weight:  [132.3 kg] 132.3 kg (10/30 2245) Last BM Date: 08/17/18  Intake/Output from previous day: 10/30 0701 - 10/31 0700 In: -  Out: 500 [Urine:500] Intake/Output this shift: No intake/output data recorded.  PE: Gen:  Alert, NAD, pleasant and non-toxic appearing  Card:  Regular rate and rhythm, pedal pulses 2+ BL  Pulm:  Normal effort, clear to auscultation bilaterally Abd: Soft, obese, diffuse lower abdominal tenderness most severe over RLQ with rebound tenderness present. +McBurneys, +Rovsings.  Skin: warm and dry, no rashes  Psych: A&Ox3   Lab Results:  Recent Labs    08/17/18 1511 08/18/18 0536  WBC 15.2* 15.5*  HGB 11.6* 10.4*  HCT 38.2 34.2*  PLT 249 205   BMET Recent Labs    08/17/18 1511 08/18/18 0536  NA 137 136  K 3.5 3.7  CL 101 106  CO2 26 26  GLUCOSE 142* 129*  BUN 8 8  CREATININE 0.77 0.77  CALCIUM 9.3 8.4*   PT/INR No results for input(s): LABPROT, INR in the last 72 hours. CMP     Component Value Date/Time   NA 136 08/18/2018 0536   K 3.7 08/18/2018 0536   CL 106 08/18/2018 0536   CO2 26 08/18/2018 0536   GLUCOSE 129 (H) 08/18/2018 0536   BUN 8  08/18/2018 0536   CREATININE 0.77 08/18/2018 0536   CALCIUM 8.4 (L) 08/18/2018 0536   PROT 7.3 08/17/2018 1511   ALBUMIN 3.7 08/17/2018 1511   AST 17 08/17/2018 1511   ALT 14 08/17/2018 1511   ALKPHOS 94 08/17/2018 1511   BILITOT 0.6 08/17/2018 1511   GFRNONAA >60 08/18/2018 0536   GFRAA >60 08/18/2018 0536   Lipase     Component Value Date/Time   LIPASE 23 08/17/2018 1511       Studies/Results: Ct Abdomen Pelvis W Contrast  Result Date: 08/17/2018 CLINICAL DATA:  Unspecified abdominal pain EXAM: CT ABDOMEN AND PELVIS WITH CONTRAST TECHNIQUE: Multidetector CT imaging of the abdomen and pelvis was performed using the standard protocol following bolus administration of intravenous contrast. CONTRAST:  152mL OMNIPAQUE IOHEXOL 300 MG/ML  SOLN COMPARISON:  None. FINDINGS: Lower chest:  No contributory findings. Hepatobiliary: No focal liver abnormality.No evidence of biliary obstruction or stone. Pancreas: Unremarkable. Spleen: Unremarkable. Adrenals/Urinary Tract: Negative adrenals. No hydronephrosis or ureteral stone. Punctate left lower pole calculus. Small calcification along the urethra without evidence of neighboring low-density to suggest underlying urethral diverticulum. Unremarkable bladder. Stomach/Bowel: Inflammatory process in the low ventral pelvis with a tubular low-density structure with enhancing wall that does not clearly connect to any bowel loops. The collection is draped over the fundus of the uterus and the bilateral ovaries are indistinct. Vascular/Lymphatic: No acute vascular abnormality. No mass or  adenopathy. Small incisional hernia containing fat and a loop of normal appearing small bowel. Reproductive:Inflammation about the adnexa as noted above. Other: No ascites or pneumoperitoneum. Musculoskeletal: No acute abnormalities. IMPRESSION: 1. Inflammation in the ventral pelvis from uncertain source. There is a tubular structure central to the inflammation, which is  adjacent to the cecum, such that appendicitis is a strong consideration and would likely be complicated by perforation. There is no appendicolith. The proximity to the uterus and indistinct adnexa also raises the possibility of PID with pyosalpinx. 2. Small left renal calculus. 3. Punctate stone along the urethra but no discrete urethral diverticulum. Electronically Signed   By: Monte Fantasia M.D.   On: 08/17/2018 18:59    Anti-infectives: Anti-infectives (From admission, onward)   Start     Dose/Rate Route Frequency Ordered Stop   08/18/18 0600  piperacillin-tazobactam (ZOSYN) IVPB 3.375 g     3.375 g 12.5 mL/hr over 240 Minutes Intravenous Every 8 hours 08/17/18 2248     08/18/18 0300  piperacillin-tazobactam (ZOSYN) IVPB 3.375 g     3.375 g 12.5 mL/hr over 240 Minutes Intravenous Every 8 hours 08/17/18 2031     08/17/18 2015  piperacillin-tazobactam (ZOSYN) IVPB 3.375 g     3.375 g 100 mL/hr over 30 Minutes Intravenous  Once 08/17/18 2006 08/17/18 2112   08/17/18 2015  metroNIDAZOLE (FLAGYL) IVPB 500 mg     500 mg 100 mL/hr over 60 Minutes Intravenous  Once 08/17/18 2006 08/17/18 2220       Assessment/Plan Acute perforated appendicitis with phlegmon - TMAX 102.8 on 10/30, other vitals are WNL, WBC 15,500 - CT concerning for PID but wet prep negative and patient denies sexual activity - no drainable abscess - IV antibiotics - Continue bowel rest, may have sips with meds - repeat labs in AM - pain: scheduled tylenol, schedule 3 days of toradol, PRN oxy, PRN dilaudid   FEN: NPO, IVF, sips  ID: Zosyn 10/30 >> VTE: SCD's, Lovenox   LOS: 1 day    Obie Dredge, [facility] Pager: [phone]

## 2018-08-19 LAB — CBC
HCT: 32.6 % — ABNORMAL LOW (ref 36.0–46.0)
Hemoglobin: 9.8 g/dL — ABNORMAL LOW (ref 12.0–15.0)
MCH: 24.4 pg — ABNORMAL LOW (ref 26.0–34.0)
MCHC: 30.1 g/dL (ref 30.0–36.0)
MCV: 81.1 fL (ref 80.0–100.0)
NRBC: 0 % (ref 0.0–0.2)
PLATELETS: 181 10*3/uL (ref 150–400)
RBC: 4.02 MIL/uL (ref 3.87–5.11)
RDW: 15.9 % — AB (ref 11.5–15.5)
WBC: 14.7 10*3/uL — AB (ref 4.0–10.5)

## 2018-08-19 LAB — BASIC METABOLIC PANEL
ANION GAP: 7 (ref 5–15)
BUN: 7 mg/dL (ref 6–20)
CALCIUM: 8.3 mg/dL — AB (ref 8.9–10.3)
CO2: 23 mmol/L (ref 22–32)
Chloride: 107 mmol/L (ref 98–111)
Creatinine, Ser: 0.72 mg/dL (ref 0.44–1.00)
GFR calc Af Amer: >60 mL/min (ref >60)
GLUCOSE: 85 mg/dL (ref 70–99)
POTASSIUM: 3.5 mmol/L (ref 3.5–5.1)
Sodium: 137 mmol/L (ref 135–145)

## 2018-08-19 MED ORDER — METHOCARBAMOL 500 MG PO TABS
1000.0000 mg | ORAL_TABLET | Freq: Three times a day (TID) | ORAL | Status: DC
  Administered 2018-08-19 – 2018-08-25 (×17): 1000 mg via ORAL
  Filled 2018-08-19 (×19): qty 2

## 2018-08-19 NOTE — Progress Notes (Signed)
[facility] Progress Note     Subjective: CC:  C/o lower abd pain, worse with movement, rated about the same as yesterday. Now having flatus. Feels like having a BM would make her feel better. Has gotten OOB to bedside commode but not to chair and not for a walk in the hall.  Objective: Vital signs in last 24 hours: Temp:  [98.2 F (36.8 C)-98.6 F (37 C)] 98.2 F (36.8 C) (11/01 0503) Pulse Rate:  [71-79] 71 (11/01 0503) Resp:  [17-18] 18 (11/01 0503) BP: (128-135)/(79-82) 135/82 (11/01 0503) SpO2:  [96 %-100 %] 96 % (11/01 0503) Last BM Date: 08/17/18  Intake/Output from previous day: 10/31 0701 - 11/01 0700 In: 3911.4 [P.O.:50; I.V.:3664.3; IV Piggyback:197.2] Out: 750 [Urine:750] Intake/Output this shift: No intake/output data recorded.  PE: Gen:  Alert, NAD, pleasant Card:  Regular rate and rhythm, pedal pulses 2+ BL Pulm:  Normal effort, clear to auscultation bilaterally Abd: Soft, TTP lower abdomen worse in RLQ, palpation LLQ elicits pain RLQ, +BS all 4 quadrants  Skin: warm and dry, no rashes  Psych: A&Ox3   Lab Results:  Recent Labs    08/18/18 0536 08/19/18 0328  WBC 15.5* 14.7*  HGB 10.4* 9.8*  HCT 34.2* 32.6*  PLT 205 181   BMET Recent Labs    08/18/18 0536 08/19/18 0328  NA 136 137  K 3.7 3.5  CL 106 107  CO2 26 23  GLUCOSE 129* 85  BUN 8 7  CREATININE 0.77 0.72  CALCIUM 8.4* 8.3*   PT/INR No results for input(s): LABPROT, INR in the last 72 hours. CMP     Component Value Date/Time   NA 137 08/19/2018 0328   K 3.5 08/19/2018 0328   CL 107 08/19/2018 0328   CO2 23 08/19/2018 0328   GLUCOSE 85 08/19/2018 0328   BUN 7 08/19/2018 0328   CREATININE 0.72 08/19/2018 0328   CALCIUM 8.3 (L) 08/19/2018 0328   PROT 7.3 08/17/2018 1511   ALBUMIN 3.7 08/17/2018 1511   AST 17 08/17/2018 1511   ALT 14 08/17/2018 1511   ALKPHOS 94 08/17/2018 1511   BILITOT 0.6 08/17/2018 1511   GFRNONAA >60 08/19/2018 0328   GFRAA >60 08/19/2018  0328   Lipase     Component Value Date/Time   LIPASE 23 08/17/2018 1511   Studies/Results: Ct Abdomen Pelvis W Contrast  Result Date: 08/17/2018 CLINICAL DATA:  Unspecified abdominal pain EXAM: CT ABDOMEN AND PELVIS WITH CONTRAST TECHNIQUE: Multidetector CT imaging of the abdomen and pelvis was performed using the standard protocol following bolus administration of intravenous contrast. CONTRAST:  135mL OMNIPAQUE IOHEXOL 300 MG/ML  SOLN COMPARISON:  None. FINDINGS: Lower chest:  No contributory findings. Hepatobiliary: No focal liver abnormality.No evidence of biliary obstruction or stone. Pancreas: Unremarkable. Spleen: Unremarkable. Adrenals/Urinary Tract: Negative adrenals. No hydronephrosis or ureteral stone. Punctate left lower pole calculus. Small calcification along the urethra without evidence of neighboring low-density to suggest underlying urethral diverticulum. Unremarkable bladder. Stomach/Bowel: Inflammatory process in the low ventral pelvis with a tubular low-density structure with enhancing wall that does not clearly connect to any bowel loops. The collection is draped over the fundus of the uterus and the bilateral ovaries are indistinct. Vascular/Lymphatic: No acute vascular abnormality. No mass or adenopathy. Small incisional hernia containing fat and a loop of normal appearing small bowel. Reproductive:Inflammation about the adnexa as noted above. Other: No ascites or pneumoperitoneum. Musculoskeletal: No acute abnormalities. IMPRESSION: 1. Inflammation in the ventral pelvis from uncertain source. There is  a tubular structure central to the inflammation, which is adjacent to the cecum, such that appendicitis is a strong consideration and would likely be complicated by perforation. There is no appendicolith. The proximity to the uterus and indistinct adnexa also raises the possibility of PID with pyosalpinx. 2. Small left renal calculus. 3. Punctate stone along the urethra but no  discrete urethral diverticulum. Electronically Signed   By: Monte Fantasia M.D.   On: 08/17/2018 18:59    Anti-infectives: Anti-infectives (From admission, onward)   Start     Dose/Rate Route Frequency Ordered Stop   08/18/18 1800  piperacillin-tazobactam (ZOSYN) IVPB 3.375 g     3.375 g 12.5 mL/hr over 240 Minutes Intravenous Every 8 hours 08/18/18 1354     08/18/18 0600  piperacillin-tazobactam (ZOSYN) IVPB 3.375 g  Status:  Discontinued     3.375 g 12.5 mL/hr over 240 Minutes Intravenous Every 8 hours 08/17/18 2248 08/18/18 1354   08/18/18 0300  piperacillin-tazobactam (ZOSYN) IVPB 3.375 g  Status:  Discontinued     3.375 g 12.5 mL/hr over 240 Minutes Intravenous Every 8 hours 08/17/18 2031 08/18/18 1353   08/17/18 2015  piperacillin-tazobactam (ZOSYN) IVPB 3.375 g     3.375 g 100 mL/hr over 30 Minutes Intravenous  Once 08/17/18 2006 08/17/18 2112   08/17/18 2015  metroNIDAZOLE (FLAGYL) IVPB 500 mg     500 mg 100 mL/hr over 60 Minutes Intravenous  Once 08/17/18 2006 08/17/18 2220     Assessment/Plan Acute perforated appendicitis with phlegmon - afebrile, VSS, WBC 14,700 from 15,500 - CT concerning for PID but wet prep negative and patient denies sexual activity - no drainable abscess - IV antibiotics - start clears - repeat labs in AM - pain: scheduled tylenol, schedule 3 days of toradol, PRN oxy, PRN dilaudid  - encourage pt to get OOB to chair and try to walk the halls  FEN: clear liquids, continue IVF at 50 cc/hr ID: Zosyn 10/30 >> VTE: SCD's, Lovenox   LOS: 2 days    Obie Dredge, [facility] Pager: [phone]

## 2018-08-20 LAB — CBC
HCT: 30.6 % — ABNORMAL LOW (ref 36.0–46.0)
HEMOGLOBIN: 9.1 g/dL — AB (ref 12.0–15.0)
MCH: 24.1 pg — AB (ref 26.0–34.0)
MCHC: 29.7 g/dL — ABNORMAL LOW (ref 30.0–36.0)
MCV: 81.2 fL (ref 80.0–100.0)
Platelets: 186 10*3/uL (ref 150–400)
RBC: 3.77 MIL/uL — AB (ref 3.87–5.11)
RDW: 16 % — ABNORMAL HIGH (ref 11.5–15.5)
WBC: 11.6 10*3/uL — ABNORMAL HIGH (ref 4.0–10.5)
nRBC: 0 % (ref 0.0–0.2)

## 2018-08-20 LAB — CULTURE, BLOOD (ROUTINE X 2): Special Requests: ADEQUATE

## 2018-08-20 MED ORDER — PHENOL 1.4 % MT LIQD: 1.0000 | OROMUCOSAL | Status: DC | PRN

## 2018-08-20 NOTE — Plan of Care (Signed)
  Problem: Education: Goal: Required Educational Video(s) Outcome: Progressing   Problem: Clinical Measurements: Goal: Postoperative complications will be avoided or minimized Outcome: Progressing   Problem: Skin Integrity: Goal: Demonstration of wound healing without infection will improve Outcome: Progressing   Problem: Elimination: Goal: Will not experience complications related to bowel motility Outcome: Progressing   Problem: Pain Managment: Goal: General experience of comfort will improve Outcome: Progressing

## 2018-08-20 NOTE — Progress Notes (Signed)
Patient ID: Carmen Moore, female   DOB: Dec 06, 1965, 52 y.o.   MRN: [id number] [facility] Progress Note:   * No surgery found *  Subjective: Mental status is clear. She understood my comments to her Objective: Vital signs in last 24 hours: Temp:  [98.4 F (36.9 C)-98.7 F (37.1 C)] 98.4 F (36.9 C) (11/02 0526) Pulse Rate:  [66-78] 76 (11/02 0526) Resp:  [20-22] 22 (11/02 0526) BP: (124-155)/(60-94) 124/60 (11/02 0526) SpO2:  [99 %-100 %] 100 % (11/02 0526)  Intake/Output from previous day: 11/01 0701 - 11/02 0700 In: 1873.3 [P.O.:440; I.V.:1280.5; IV Piggyback:152.8] Out: 850 [Urine:450; Stool:400] Intake/Output this shift: No intake/output data recorded.  Physical Exam: Work of breathing is not labored.  CT drain in place-repeat tomorrow  Lab Results:  Results for orders placed or performed during the hospital encounter of 08/17/18 (from the past 48 hour(s))  CBC     Status: Abnormal   Collection Time: 08/19/18  3:28 AM  Result Value Ref Range   WBC 14.7 (H) 4.0 - 10.5 K/uL   RBC 4.02 3.87 - 5.11 MIL/uL   Hemoglobin 9.8 (L) 12.0 - 15.0 g/dL   HCT 32.6 (L) 36.0 - 46.0 %   MCV 81.1 80.0 - 100.0 fL   MCH 24.4 (L) 26.0 - 34.0 pg   MCHC 30.1 30.0 - 36.0 g/dL   RDW 15.9 (H) 11.5 - 15.5 %   Platelets 181 150 - 400 K/uL   nRBC 0.0 0.0 - 0.2 %    Comment: Performed at [facility], [address] [place], [place] [address]  Basic metabolic panel     Status: Abnormal   Collection Time: 08/19/18  3:28 AM  Result Value Ref Range   Sodium 137 135 - 145 mmol/L   Potassium 3.5 3.5 - 5.1 mmol/L   Chloride 107 98 - 111 mmol/L   CO2 23 22 - 32 mmol/L   Glucose, Bld 85 70 - 99 mg/dL   BUN 7 6 - 20 mg/dL   Creatinine, Ser 0.72 0.44 - 1.00 mg/dL   Calcium 8.3 (L) 8.9 - 10.3 mg/dL   GFR calc non Af Amer >60 >60 mL/min   GFR calc Af Amer >60 >60 mL/min    Comment: (NOTE) The eGFR has been calculated using the CKD EPI equation. This calculation has not been validated  in all clinical situations. eGFR's persistently <60 mL/min signify possible Chronic Kidney Disease.    Anion gap 7 5 - 15    Comment: Performed at [facility] [address] [place], [place] [address]  CBC     Status: Abnormal   Collection Time: 08/20/18  2:36 AM  Result Value Ref Range   WBC 11.6 (H) 4.0 - 10.5 K/uL   RBC 3.77 (L) 3.87 - 5.11 MIL/uL   Hemoglobin 9.1 (L) 12.0 - 15.0 g/dL   HCT 30.6 (L) 36.0 - 46.0 %   MCV 81.2 80.0 - 100.0 fL   MCH 24.1 (L) 26.0 - 34.0 pg   MCHC 29.7 (L) 30.0 - 36.0 g/dL   RDW 16.0 (H) 11.5 - 15.5 %   Platelets 186 150 - 400 K/uL   nRBC 0.0 0.0 - 0.2 %    Comment: Performed at [facility] [address] [place], [place] [address]    Radiology/Results: No results found.  Anti-infectives: Anti-infectives (From admission, onward)   Start     Dose/Rate Route Frequency Ordered Stop   08/18/18 1800  piperacillin-tazobactam (  ZOSYN) IVPB 3.375 g     3.375 g 12.5 mL/hr over 240 Minutes Intravenous Every 8 hours 08/18/18 1354     08/18/18 0600  piperacillin-tazobactam (ZOSYN) IVPB 3.375 g  Status:  Discontinued     3.375 g 12.5 mL/hr over 240 Minutes Intravenous Every 8 hours 08/17/18 2248 08/18/18 1354   08/18/18 0300  piperacillin-tazobactam (ZOSYN) IVPB 3.375 g  Status:  Discontinued     3.375 g 12.5 mL/hr over 240 Minutes Intravenous Every 8 hours 08/17/18 2031 08/18/18 1353   08/17/18 2015  piperacillin-tazobactam (ZOSYN) IVPB 3.375 g     3.375 g 100 mL/hr over 30 Minutes Intravenous  Once 08/17/18 2006 08/17/18 2112   08/17/18 2015  metroNIDAZOLE (FLAGYL) IVPB 500 mg     500 mg 100 mL/hr over 60 Minutes Intravenous  Once 08/17/18 2006 08/17/18 2220      Assessment/Plan: Problem List: Patient Active Problem List   Diagnosis Date Noted  . Ruptured appendicitis 08/17/2018  . Menometrorrhagia 12/31/2014  . Fibroid 12/31/2014  . Normal coronary arteries 10/20/2013  . Morbid obesity ([place]) 10/20/2013  . Deafness  10/20/2013  . Chest pain 10/10/2013  . HTN (hypertension) 10/10/2013  . Hyperlipidemia 10/10/2013  . Diabetes mellitus ([place]) 10/10/2013  . Asthma 10/10/2013    Drained appendicitis with phlegmon.  Repeat CT ordered.   * No surgery found *    LOS: 3 days   Matt B. Hassell Done, MD, [facility] [phone] beeper [phone]  08/20/2018 9:40 AM

## 2018-08-21 ENCOUNTER — Encounter (HOSPITAL_COMMUNITY): Payer: Self-pay | Admitting: Radiology

## 2018-08-21 ENCOUNTER — Inpatient Hospital Stay (HOSPITAL_COMMUNITY): Payer: Medicare (Managed Care)

## 2018-08-21 LAB — CBC
HEMATOCRIT: 33.5 % — AB (ref 36.0–46.0)
HEMOGLOBIN: 9.8 g/dL — AB (ref 12.0–15.0)
MCH: 23.4 pg — AB (ref 26.0–34.0)
MCHC: 29.3 g/dL — AB (ref 30.0–36.0)
MCV: 80.1 fL (ref 80.0–100.0)
Platelets: 251 10*3/uL (ref 150–400)
RBC: 4.18 MIL/uL (ref 3.87–5.11)
RDW: 15.7 % — ABNORMAL HIGH (ref 11.5–15.5)
WBC: 8.6 10*3/uL (ref 4.0–10.5)
nRBC: 0 % (ref 0.0–0.2)

## 2018-08-21 MED ORDER — ENOXAPARIN SODIUM 40 MG/0.4ML ~~LOC~~ SOLN
40.0000 mg | SUBCUTANEOUS | Status: DC
  Administered 2018-08-22 – 2018-08-24 (×3): 40 mg via SUBCUTANEOUS
  Filled 2018-08-21 (×3): qty 0.4

## 2018-08-21 MED ORDER — IOHEXOL 300 MG/ML  SOLN
100.0000 mL | Freq: Once | INTRAMUSCULAR | Status: AC | PRN
  Administered 2018-08-21: 100 mL via INTRAVENOUS

## 2018-08-21 NOTE — Progress Notes (Signed)
Pt placed on nasal mask AUTO CPAP and tolerating well at this time.  RT will continue to monitor.

## 2018-08-21 NOTE — Consult Note (Addendum)
Chief Complaint: Patient was seen in consultation today for image guided drainage of pelvic abscess Chief Complaint  Patient presents with  . Urinary Tract Infection    Referring Physician(s): Martin,M  Supervising Physician: Aletta Edouard  Patient Status: [facility] - In-pt  History of Present Illness: Carmen Moore is a 52 y.o. female who is deaf and a Jehovah's Moore admitted to [facility] on 08/17/2018 with lower abdominal pain along with urinary frequency and burning.  Subsequent imaging revealed an enlarging multiloculated tubular structure with enhancing walls in the ventral pelvis/ adjacent 4.8 cm loculated fluid collection and surrounding inflammatory changes concerning for potential appendiceal abscess versus pyosalpinx.  Blood cultures have revealed MRSA. Genital wet prep neg. WBC normal, hemoglobin 9.8, platelets 251k, creat nl.  Request received from surgery for image guided drainage of the pelvic abscess.  Additional medical history as listed below.  Past Medical History:  Diagnosis Date  . Asthma   . CAD S/P percutaneous coronary angioplasty: PROBABLE PER PT DESCRIPTION OF CATH 2006 10/10/2013  . Complication of anesthesia    " A MASK CAUSED HER TO STOP BREATHING ,i ONLY WANT IV "  . Deaf   . Diabetes mellitus without complication ([place])   . Fibroids   . HTN (hypertension) 10/10/2013  . Hyperlipidemia   . Hypertension   . Obesity   . Refusal of blood product   . Stroke [facility]) 2019    Past Surgical History:  Procedure Laterality Date  . CARDIAC CATHETERIZATION    . DILATION AND CURETTAGE OF UTERUS    . HERNIA REPAIR    . LEFT HEART CATHETERIZATION WITH CORONARY ANGIOGRAM N/A 10/11/2013   Procedure: LEFT HEART CATHETERIZATION WITH CORONARY ANGIOGRAM;  Surgeon: Lorretta Harp, MD;  Location: [facility] CATH LAB;  Service: Cardiovascular;  Laterality: N/A;    Allergies: Tomato; Beef-derived products; Nitroglycerin; Pollen extract; and  Latex  Medications: Prior to Admission medications   Medication Sig Start Date End Date Taking? Authorizing Provider  busPIRone (BUSPAR) 7.5 MG tablet Take 7.5 mg by mouth 2 (two) times daily.   Yes [provider]  citalopram (CELEXA) 10 MG tablet Take 10 mg by mouth daily.   Yes [provider]  clopidogrel (PLAVIX) 75 MG tablet Take 75 mg by mouth daily.   Yes [provider]  famotidine (PEPCID) 20 MG tablet Take 1 tablet (20 mg total) by mouth 2 (two) times daily. Patient taking differently: Take 20 mg by mouth daily.  01/31/15  Yes Woodroe Mode, MD  ferrous sulfate 325 (65 FE) MG tablet Take 325 mg by mouth daily with breakfast.   Yes [provider]  lisinopril (PRINIVIL,ZESTRIL) 10 MG tablet Take 1 tablet (10 mg total) by mouth daily. 01/31/15  Yes Woodroe Mode, MD  loratadine (CLARITIN) 10 MG tablet Take 10 mg by mouth daily as needed for allergies.   Yes [provider]  metFORMIN (GLUCOPHAGE) 500 MG tablet Take 500 mg by mouth 2 (two) times daily with a meal.   Yes [provider]  Nebivolol HCl (BYSTOLIC) 20 MG TABS Take 20 mg by mouth 2 (two) times daily.   Yes [provider]  traZODone (DESYREL) 50 MG tablet Take 50 mg by mouth at bedtime.   Yes [provider]  HYDROcodone-acetaminophen (NORCO/VICODIN) 5-325 MG per tablet Take 1-2 tablets by mouth every 4 (four) hours as needed. Patient not taking: Reported on 08/17/2018 12/06/14   Ashley Murrain, NP  levalbuterol [facility] HFA) 45 MCG/ACT  inhaler Inhale 2 puffs into the lungs every 6 (six) hours as needed for wheezing. 10/20/13   Erlene Quan, PA-C  megestrol (MEGACE) 20 MG tablet TAKE 2 TABLETS BY MOUTH DAILY Patient not taking: Reported on 08/17/2018 06/28/15   Woodroe Mode, MD  metoprolol (LOPRESSOR) 50 MG tablet TAKE 1 TABLET BY MOUTH DAILY Patient not taking: Reported on 08/17/2018 02/01/15   Woodroe Mode, MD  naproxen (NAPROSYN) 500 MG tablet  Take 1 tablet (500 mg total) by mouth 2 (two) times daily. Patient not taking: Reported on 12/31/2014 12/06/14   Ashley Murrain, NP  ondansetron (ZOFRAN) 4 MG tablet Take 1 tablet (4 mg total) by mouth every 6 (six) hours. Patient not taking: Reported on 12/31/2014 12/06/14   Ashley Murrain, NP     Family History  Problem Relation Age of Onset  . Hypertension Mother     Social History   Socioeconomic History  . Marital status: Single    Spouse name: Not on file  . Number of children: Not on file  . Years of education: Not on file  . Highest education level: Not on file  Occupational History  . Not on file  Social Needs  . Financial resource strain: Not on file  . Food insecurity:    Worry: Not on file    Inability: Not on file  . Transportation needs:    Medical: Not on file    Non-medical: Not on file  Tobacco Use  . Smoking status: Never Smoker  . Smokeless tobacco: Never Used  Substance and Sexual Activity  . Alcohol use: No  . Drug use: No  . Sexual activity: Not Currently    Birth control/protection: None  Lifestyle  . Physical activity:    Days per week: Not on file    Minutes per session: Not on file  . Stress: Not on file  Relationships  . Social connections:    Talks on phone: Not on file    Gets together: Not on file    Attends religious service: Not on file    Active member of club or organization: Not on file    Attends meetings of clubs or organizations: Not on file    Relationship status: Not on file  Other Topics Concern  . Not on file  Social History Narrative  . Not on file      Review of Systems probably denies fever, headache, breathing difficulties, nausea, vomiting or bleeding.  Vital Signs: BP (!) 155/75 (BP Location: Left Arm)   Pulse 71   Temp 99.5 F (37.5 C) (Oral)   Resp 17   Ht 5\' 5"  (1.651 m)   Wt 291 lb 9.6 oz (132.3 kg)   LMP 06/19/2018   SpO2 99%   BMI 48.52 kg/m   Physical Exam awake, alert.  Chest clear to  auscultation bilaterally.  Heart with regular rate and rhythm.  Abdomen soft, obese, positive bowel sounds, tender central pelvic region; extremities with full range of motion.  Imaging: Ct Abdomen Pelvis W Contrast  Result Date: 08/21/2018 CLINICAL DATA:  Drained appendicitis. EXAM: CT ABDOMEN AND PELVIS WITH CONTRAST TECHNIQUE: Multidetector CT imaging of the abdomen and pelvis was performed using the standard protocol following bolus administration of intravenous contrast. CONTRAST:  131mL OMNIPAQUE IOHEXOL 300 MG/ML  SOLN COMPARISON:  08/17/2018 FINDINGS: Lower chest: No acute abnormality. Hepatobiliary: No focal liver abnormality is seen. No gallstones, gallbladder wall thickening, or biliary dilatation. Pancreas: Unremarkable. No pancreatic ductal dilatation  or surrounding inflammatory changes. Spleen: Normal in size without focal abnormality. Adrenals/Urinary Tract: Adrenal glands are unremarkable. Kidneys are normal, without renal calculi, focal lesion, or hydronephrosis. Bladder is unremarkable. Stomach/Bowel: Normal appearance of the stomach and small bowel. Scattered colonic diverticulosis without evidence of diverticulitis. The previously demonstrated inflammation within the ventral pelvis persists with enlarging multiloculated tubular structure with wall enhancement central to it. Isodense fluid collection posterior to the cecum measures 4.8 x 3.9 cm and communicates with the tubular structure. The process abuts the right adnexa and fundus of the uterus without fat plane separating these structures from it. Vascular/Lymphatic: No significant vascular findings are present. No enlarged abdominal or pelvic lymph nodes. Sub pathologic likely reactive mesenteric lymph nodes in the right lower quadrant of the abdomen. Other: Left parasagittal infraumbilical fat containing ventral abdominal wall hernia. Musculoskeletal: No acute or significant osseous findings. IMPRESSION: Enlarging multiloculated tubular  structure with enhancing walls in the ventral pelvis, with adjacent 4.8 cm loculated fluid collection and surrounding inflammatory changes. The process abuts the cecum as well as the right adnexa and the anterior fundus of the uterus, without fat plane separating it from any of these structures. If this represents an appendiceal abscess, the abscess is enlarging. No free gas within the abdomen. Electronically Signed   By: Fidela Salisbury M.D.   On: 08/21/2018 12:11   Ct Abdomen Pelvis W Contrast  Result Date: 08/17/2018 CLINICAL DATA:  Unspecified abdominal pain EXAM: CT ABDOMEN AND PELVIS WITH CONTRAST TECHNIQUE: Multidetector CT imaging of the abdomen and pelvis was performed using the standard protocol following bolus administration of intravenous contrast. CONTRAST:  146mL OMNIPAQUE IOHEXOL 300 MG/ML  SOLN COMPARISON:  None. FINDINGS: Lower chest:  No contributory findings. Hepatobiliary: No focal liver abnormality.No evidence of biliary obstruction or stone. Pancreas: Unremarkable. Spleen: Unremarkable. Adrenals/Urinary Tract: Negative adrenals. No hydronephrosis or ureteral stone. Punctate left lower pole calculus. Small calcification along the urethra without evidence of neighboring low-density to suggest underlying urethral diverticulum. Unremarkable bladder. Stomach/Bowel: Inflammatory process in the low ventral pelvis with a tubular low-density structure with enhancing wall that does not clearly connect to any bowel loops. The collection is draped over the fundus of the uterus and the bilateral ovaries are indistinct. Vascular/Lymphatic: No acute vascular abnormality. No mass or adenopathy. Small incisional hernia containing fat and a loop of normal appearing small bowel. Reproductive:Inflammation about the adnexa as noted above. Other: No ascites or pneumoperitoneum. Musculoskeletal: No acute abnormalities. IMPRESSION: 1. Inflammation in the ventral pelvis from uncertain source. There is a  tubular structure central to the inflammation, which is adjacent to the cecum, such that appendicitis is a strong consideration and would likely be complicated by perforation. There is no appendicolith. The proximity to the uterus and indistinct adnexa also raises the possibility of PID with pyosalpinx. 2. Small left renal calculus. 3. Punctate stone along the urethra but no discrete urethral diverticulum. Electronically Signed   By: Monte Fantasia M.D.   On: 08/17/2018 18:59    Labs:  CBC: Recent Labs    08/18/18 0536 08/19/18 0328 08/20/18 0236 08/21/18 0438  WBC 15.5* 14.7* 11.6* 8.6  HGB 10.4* 9.8* 9.1* 9.8*  HCT 34.2* 32.6* 30.6* 33.5*  PLT 205 181 186 251    COAGS: No results for input(s): INR, APTT in the last 8760 hours.  BMP: Recent Labs    08/17/18 1511 08/18/18 0536 08/19/18 0328  NA 137 136 137  K 3.5 3.7 3.5  CL 101 106 107  CO2 26 26  23  GLUCOSE 142* 129* 85  BUN 8 8 7   CALCIUM 9.3 8.4* 8.3*  CREATININE 0.77 0.77 0.72  GFRNONAA >60 >60 >60  GFRAA >60 >60 >60    LIVER FUNCTION TESTS: Recent Labs    08/17/18 1511  BILITOT 0.6  AST 17  ALT 14  ALKPHOS 94  PROT 7.3  ALBUMIN 3.7    TUMOR MARKERS: No results for input(s): AFPTM, CEA, CA199, CHROMGRNA in the last 8760 hours.  Assessment and Plan: 52 y.o. female who is deaf and a Carmen Moore admitted to [facility] on 08/17/2018 with lower abdominal pain along with urinary frequency and burning.  Subsequent imaging revealed an enlarging multiloculated tubular structure with enhancing walls in the ventral pelvis/ adjacent 4.8 cm loculated fluid collection and surrounding inflammatory changes concerning for potential appendiceal abscess versus pyosalpinx.  Blood cultures have revealed MRSA. Genital wet prep neg. WBC normal, hemoglobin 9.8, platelets 251k, creat nl.  Request received from surgery for image guided drainage of the pelvic abscess.  Imaging studies were reviewed by Dr. Earleen Newport.Risks  and benefits discussed with the patient via sign interpreter including bleeding, infection, damage to adjacent structures, bowel perforation/fistula connection, and sepsis.  All of the patient's questions were answered, patient is agreeable to proceed. Consent signed and in chart.  Procedure tent scheduled for 11/4; last dose of plavix was prior to admission on 08/17/18; will also hold lovenox   Thank you for this interesting consult.  I greatly enjoyed meeting Marita Burnsed and look forward to participating in their care.  A copy of this report was sent to the requesting provider on this date.  Electronically Signed: D. Rowe Robert, PA-C 08/21/2018, 2:58 PM   I spent a total of 40 minutes    in face to face in clinical consultation, greater than 50% of which was counseling/coordinating care for image guided pelvic abscess drainage

## 2018-08-21 NOTE — Plan of Care (Signed)
  Problem: Education: Goal: Required Educational Video(s) Outcome: Progressing   Problem: Coping: Goal: Level of anxiety will decrease Outcome: Progressing   Problem: Elimination: Goal: Will not experience complications related to bowel motility Outcome: Progressing

## 2018-08-21 NOTE — Progress Notes (Signed)
Patient ID: Carmen Moore, female   DOB: October 07, 1966, 52 y.o.   MRN: [id number]  Subjective/Chief Complaint: Lower abdominal pain, rating it as mild with improvement since admission. Patient states that she has had about 5 episodes of watery BMs since yesterday and has flatulence. Denies hematochezia, nausea, and vomiting. Patient inquired about when she would be able to eat.  Patient also has concerns about not having access to her CPAP machine for sleep apnea. She reports that she woke up choking last night. Denies chest pain, SOB.   Objective: Vital signs in last 24 hours: Temp:  [98.5 F (36.9 C)-99.5 F (37.5 C)] 98.6 F (37 C) (11/03 0606) Pulse Rate:  [71-75] 75 (11/03 0606) Resp:  [17-18] 18 (11/03 0606) BP: (128-158)/(71-85) 158/85 (11/03 0606) SpO2:  [98 %-99 %] 99 % (11/03 0606) Last BM Date: 08/21/18  Intake/Output from previous day: 11/02 0701 - 11/03 0700 In: 2691.7 [P.O.:1337; I.V.:1158.3; IV Piggyback:196.4] Out: -  Intake/Output this shift: Total I/O In: -  Out: 900 [Urine:900]  General appearance: alert, cooperative, no distress and pleasant  Resp: clear to auscultation bilaterally and normal effort Cardio: regular rate and rhythm, S1, S2 normal, no murmur, click, rub or gallop GI: BS present, soft, nondistended. mild pain throughout the lower abdomen, worst in RLQ  Lab Results:  Recent Labs    08/20/18 0236 08/21/18 0438  WBC 11.6* 8.6  HGB 9.1* 9.8*  HCT 30.6* 33.5*  PLT 186 251   BMET Recent Labs    08/19/18 0328  NA 137  K 3.5  CL 107  CO2 23  GLUCOSE 85  BUN 7  CREATININE 0.72  CALCIUM 8.3*    Anti-infectives: Anti-infectives (From admission, onward)   Start     Dose/Rate Route Frequency Ordered Stop   08/18/18 1800  piperacillin-tazobactam (ZOSYN) IVPB 3.375 g     3.375 g 12.5 mL/hr over 240 Minutes Intravenous Every 8 hours 08/18/18 1354     08/18/18 0600  piperacillin-tazobactam (ZOSYN) IVPB 3.375 g  Status:  Discontinued     3.375 g 12.5 mL/hr over 240 Minutes Intravenous Every 8 hours 08/17/18 2248 08/18/18 1354   08/18/18 0300  piperacillin-tazobactam (ZOSYN) IVPB 3.375 g  Status:  Discontinued     3.375 g 12.5 mL/hr over 240 Minutes Intravenous Every 8 hours 08/17/18 2031 08/18/18 1353   08/17/18 2015  piperacillin-tazobactam (ZOSYN) IVPB 3.375 g     3.375 g 100 mL/hr over 30 Minutes Intravenous  Once 08/17/18 2006 08/17/18 2112   08/17/18 2015  metroNIDAZOLE (FLAGYL) IVPB 500 mg     500 mg 100 mL/hr over 60 Minutes Intravenous  Once 08/17/18 2006 08/17/18 2220      Assessment/Plan: s/p * No surgery found *  Acute perforated appendicitis  FEN: start carb modified full liquids, continue IV fluids ID: continue Zosyn  Follow with serial exams   LOS: 4 days    Magnus Ivan 08/21/2018

## 2018-08-21 NOTE — Plan of Care (Signed)
  Problem: Clinical Measurements: Goal: Ability to maintain clinical measurements within normal limits will improve Outcome: Not Progressing   Problem: Elimination: Goal: Will not experience complications related to bowel motility Outcome: Not Progressing

## 2018-08-22 ENCOUNTER — Inpatient Hospital Stay (HOSPITAL_COMMUNITY): Payer: Medicare (Managed Care)

## 2018-08-22 LAB — CULTURE, BLOOD (ROUTINE X 2)
Culture: NO GROWTH
Special Requests: ADEQUATE

## 2018-08-22 LAB — CBC WITH DIFFERENTIAL/PLATELET
ABS IMMATURE GRANULOCYTES: 0.03 10*3/uL (ref 0.00–0.07)
BASOS PCT: 0 %
Basophils Absolute: 0 10*3/uL (ref 0.0–0.1)
EOS ABS: 0.1 10*3/uL (ref 0.0–0.5)
Eosinophils Relative: 1 %
HCT: 33.5 % — ABNORMAL LOW (ref 36.0–46.0)
Hemoglobin: 9.8 g/dL — ABNORMAL LOW (ref 12.0–15.0)
Immature Granulocytes: 0 %
Lymphocytes Relative: 26 %
Lymphs Abs: 1.9 10*3/uL (ref 0.7–4.0)
MCH: 23.2 pg — ABNORMAL LOW (ref 26.0–34.0)
MCHC: 29.3 g/dL — AB (ref 30.0–36.0)
MCV: 79.4 fL — AB (ref 80.0–100.0)
MONO ABS: 0.8 10*3/uL (ref 0.1–1.0)
MONOS PCT: 11 %
NEUTROS ABS: 4.5 10*3/uL (ref 1.7–7.7)
Neutrophils Relative %: 62 %
PLATELETS: 266 10*3/uL (ref 150–400)
RBC: 4.22 MIL/uL (ref 3.87–5.11)
RDW: 15.8 % — AB (ref 11.5–15.5)
WBC: 7.3 10*3/uL (ref 4.0–10.5)
nRBC: 0 % (ref 0.0–0.2)

## 2018-08-22 LAB — COMPREHENSIVE METABOLIC PANEL
ALBUMIN: 2.9 g/dL — AB (ref 3.5–5.0)
ALK PHOS: 158 U/L — AB (ref 38–126)
ALT: 28 U/L (ref 0–44)
ANION GAP: 6 (ref 5–15)
AST: 28 U/L (ref 15–41)
BILIRUBIN TOTAL: 0.5 mg/dL (ref 0.3–1.2)
CO2: 24 mmol/L (ref 22–32)
Calcium: 8.7 mg/dL — ABNORMAL LOW (ref 8.9–10.3)
Chloride: 107 mmol/L (ref 98–111)
Creatinine, Ser: 0.7 mg/dL (ref 0.44–1.00)
GFR calc Af Amer: >60 mL/min (ref >60)
GFR calc non Af Amer: >60 mL/min (ref >60)
GLUCOSE: 101 mg/dL — AB (ref 70–99)
Potassium: 3.3 mmol/L — ABNORMAL LOW (ref 3.5–5.1)
Sodium: 137 mmol/L (ref 135–145)
TOTAL PROTEIN: 6.8 g/dL (ref 6.5–8.1)

## 2018-08-22 LAB — PROTIME-INR
INR: 1.05
Prothrombin Time: 13.6 seconds (ref 11.4–15.2)

## 2018-08-22 LAB — MAGNESIUM: Magnesium: 1.8 mg/dL (ref 1.7–2.4)

## 2018-08-22 MED ORDER — MIDAZOLAM HCL 2 MG/2ML IJ SOLN
INTRAMUSCULAR | Status: AC
  Filled 2018-08-22: qty 2

## 2018-08-22 MED ORDER — SODIUM CHLORIDE 0.9 % IV SOLN
INTRAVENOUS | Status: DC | PRN
  Administered 2018-08-22: 10 mL/h via INTRAVENOUS

## 2018-08-22 MED ORDER — POTASSIUM CHLORIDE IN NACL 40-0.9 MEQ/L-% IV SOLN
INTRAVENOUS | Status: DC
  Administered 2018-08-22 – 2018-08-23 (×2): 75 mL/h via INTRAVENOUS
  Administered 2018-08-23: 50 mL/h via INTRAVENOUS
  Filled 2018-08-22 (×4): qty 1000

## 2018-08-22 MED ORDER — FENTANYL CITRATE (PF) 100 MCG/2ML IJ SOLN
INTRAMUSCULAR | Status: AC
  Filled 2018-08-22: qty 2

## 2018-08-22 MED ORDER — LIDOCAINE HCL 1 % IJ SOLN
INTRAMUSCULAR | Status: AC
  Filled 2018-08-22: qty 20

## 2018-08-22 NOTE — Progress Notes (Signed)
Pt is refusing CPAP for tonight. Pt states she doesn't like the nasal mask because air blows into her eyes, and at home she uses nasal pillows. Advised pt that if she has someone that can bring her nasal pillows and tubing, that we can use her device with our machine. Pt states she is okay for now. Also advised pt that if she decides to wear CPAP tonight, to notify for RT.

## 2018-08-22 NOTE — Progress Notes (Signed)
CC:  Abdominal pain  Subjective: She says she had several bowel movements and she actually feels better this morning.  She is up and cleaning off her table when I came into the room.  She points to her lower abdomen below the umbilicus as the area of discomfort.  From this position I could not really tell whether it was more the right side of the left.  Overall she seems very comfortable this a.m.  Objective: Vital signs in last 24 hours: Temp:  [98.7 F (37.1 C)-99.5 F (37.5 C)] 98.7 F (37.1 C) (11/04 0629) Pulse Rate:  [71-81] 81 (11/04 0629) Resp:  [15-18] 18 (11/04 0629) BP: (153-155)/(75-92) 153/92 (11/04 0629) SpO2:  [98 %-100 %] 100 % (11/04 0629) Last BM Date: 08/21/18 533 IV  NPO 1200 urine Stool x 4 Afebrile, VSS, BP up x 1  K+ 3.3 WBC 15.5 (10/31)  >> 7.3 (today) CT yesterday shows increasing fluid collection  Intake/Output from previous day: 11/03 0701 - 11/04 0700 In: 533.9 [I.V.:462.1; IV Piggyback:71.8] Out: 1200 [Urine:1200] Intake/Output this shift: No intake/output data recorded.  General appearance: alert, cooperative and no distress Resp: clear to auscultation bilaterally GI: Soft, complains of soreness lower abdomen.  She reports the pain is much better and wanted to know if she could have something to drink.  Lab Results:  Recent Labs    08/21/18 0438 08/22/18 0532  WBC 8.6 7.3  HGB 9.8* 9.8*  HCT 33.5* 33.5*  PLT 251 266    BMET Recent Labs    08/22/18 0532  NA 137  K 3.3*  CL 107  CO2 24  GLUCOSE 101*  BUN <5*  CREATININE 0.70  CALCIUM 8.7*   PT/INR Recent Labs    08/22/18 0532  LABPROT 13.6  INR 1.05    Recent Labs  Lab 08/17/18 1511 08/22/18 0532  AST 17 28  ALT 14 28  ALKPHOS 94 158*  BILITOT 0.6 0.5  PROT 7.3 6.8  ALBUMIN 3.7 2.9*     Lipase     Component Value Date/Time   LIPASE 23 08/17/2018 1511     Prior to Admission medications   Medication Sig Start Date End Date Taking? Authorizing  Provider  busPIRone (BUSPAR) 7.5 MG tablet Take 7.5 mg by mouth 2 (two) times daily.   Yes [provider]  citalopram (CELEXA) 10 MG tablet Take 10 mg by mouth daily.   Yes [provider]  clopidogrel (PLAVIX) 75 MG tablet Take 75 mg by mouth daily.   Yes [provider]  famotidine (PEPCID) 20 MG tablet Take 1 tablet (20 mg total) by mouth 2 (two) times daily. Patient taking differently: Take 20 mg by mouth daily.  01/31/15  Yes Woodroe Mode, MD  ferrous sulfate 325 (65 FE) MG tablet Take 325 mg by mouth daily with breakfast.   Yes [provider]  lisinopril (PRINIVIL,ZESTRIL) 10 MG tablet Take 1 tablet (10 mg total) by mouth daily. 01/31/15  Yes Woodroe Mode, MD  loratadine (CLARITIN) 10 MG tablet Take 10 mg by mouth daily as needed for allergies.   Yes [provider]  metFORMIN (GLUCOPHAGE) 500 MG tablet Take 500 mg by mouth 2 (two) times daily with a meal.   Yes [provider]  Nebivolol HCl (BYSTOLIC) 20 MG TABS Take 20 mg by mouth 2 (two) times daily.   Yes [provider]  traZODone (DESYREL) 50 MG tablet Take 50 mg by mouth at bedtime.  Yes [provider]  HYDROcodone-acetaminophen (NORCO/VICODIN) 5-325 MG per tablet Take 1-2 tablets by mouth every 4 (four) hours as needed. Patient not taking: Reported on 08/17/2018 12/06/14   Ashley Murrain, NP  levalbuterol [facility] HFA) 45 MCG/ACT inhaler Inhale 2 puffs into the lungs every 6 (six) hours as needed for wheezing. 10/20/13   Erlene Quan, PA-C  megestrol (MEGACE) 20 MG tablet TAKE 2 TABLETS BY MOUTH DAILY Patient not taking: Reported on 08/17/2018 06/28/15   Woodroe Mode, MD  metoprolol (LOPRESSOR) 50 MG tablet TAKE 1 TABLET BY MOUTH DAILY Patient not taking: Reported on 08/17/2018 02/01/15   Woodroe Mode, MD  naproxen (NAPROSYN) 500 MG tablet Take 1 tablet (500 mg total) by mouth 2 (two) times daily. Patient not taking: Reported on 12/31/2014 12/06/14    Ashley Murrain, NP  ondansetron (ZOFRAN) 4 MG tablet Take 1 tablet (4 mg total) by mouth every 6 (six) hours. Patient not taking: Reported on 12/31/2014 12/06/14   Ashley Murrain, NP    Medications: . acetaminophen  1,000 mg Oral Q6H  . busPIRone  7.5 mg Oral BID  . citalopram  10 mg Oral Daily  . enoxaparin (LOVENOX) injection  40 mg Subcutaneous Q24H  . lisinopril  10 mg Oral Daily  . mouth rinse  15 mL Mouth Rinse BID  . methocarbamol  1,000 mg Oral Q8H  . nebivolol  20 mg Oral BID  . traZODone  50 mg Oral QHS   . 0.9 % NaCl with KCl 20 mEq / L 50 mL/hr at 08/21/18 2333  . piperacillin-tazobactam (ZOSYN)  IV 3.375 g (08/22/18 2706)   Anti-infectives (From admission, onward)   Start     Dose/Rate Route Frequency Ordered Stop   08/18/18 1800  piperacillin-tazobactam (ZOSYN) IVPB 3.375 g     3.375 g 12.5 mL/hr over 240 Minutes Intravenous Every 8 hours 08/18/18 1354     08/18/18 0600  piperacillin-tazobactam (ZOSYN) IVPB 3.375 g  Status:  Discontinued     3.375 g 12.5 mL/hr over 240 Minutes Intravenous Every 8 hours 08/17/18 2248 08/18/18 1354   08/18/18 0300  piperacillin-tazobactam (ZOSYN) IVPB 3.375 g  Status:  Discontinued     3.375 g 12.5 mL/hr over 240 Minutes Intravenous Every 8 hours 08/17/18 2031 08/18/18 1353   08/17/18 2015  piperacillin-tazobactam (ZOSYN) IVPB 3.375 g     3.375 g 100 mL/hr over 30 Minutes Intravenous  Once 08/17/18 2006 08/17/18 2112   08/17/18 2015  metroNIDAZOLE (FLAGYL) IVPB 500 mg     500 mg 100 mL/hr over 60 Minutes Intravenous  Once 08/17/18 2006 08/17/18 2220      Assessment/Plan Hx CAD/angioplasty - Daily Plavix last dose 08/17/18 Deaf Hx Type 2 diabetes  - glucose essentially normal here  Hypertension BMI 48.5 Hypokalemia - mag pending replacing K+ via IV fluid   Acute perforated appendicitis with phlegmon - afebrile, VSS, WBC 14,700 from 15,500 - CT 10/30: concerning for PID but wet prep negative and patient denies sexual  activity - CT 11/3: Enlarging 4.8 cm loculated fluid collections that abuts the cecum as well as the right       adnexa/anterior fundus of the uterus. - IR evaluating for Drain    FEN: NPO/ IV fluids @ 50 ml/hr ID: Zosyn 10/30  =>> day 6 VTE: SCD's, Lovenox  Plan: She had a repeat CT scan yesterday and is been evaluated by IR for possible drain placement today.  Her potassium is down a little  bit and I am going to replace this in the IV fluids.  We will continue current antibiotic therapy.      LOS: 5 days    Ersel Enslin 08/22/[phone]

## 2018-08-22 NOTE — Progress Notes (Signed)
Interventional Radiology  Patient brought down for possible CT guided drainage. By CT, there is not a good percutaneous window anteriorly to the fluid abutting the uterus due to overlying SB and colon.  Recommend follow up CT in few days w/ IV contrast.   Carmen Moore, M.D Pager:  [phone]

## 2018-08-23 LAB — BASIC METABOLIC PANEL
Anion gap: 9 (ref 5–15)
BUN: <5 mg/dL — ABNORMAL LOW (ref 6–20)
CHLORIDE: 103 mmol/L (ref 98–111)
CO2: 24 mmol/L (ref 22–32)
CREATININE: 0.69 mg/dL (ref 0.44–1.00)
Calcium: 9 mg/dL (ref 8.9–10.3)
GFR calc non Af Amer: >60 mL/min (ref >60)
GLUCOSE: 138 mg/dL — AB (ref 70–99)
POTASSIUM: 3.9 mmol/L (ref 3.5–5.1)
SODIUM: 136 mmol/L (ref 135–145)

## 2018-08-23 LAB — CBC
HEMATOCRIT: 36.1 % (ref 36.0–46.0)
HEMOGLOBIN: 10.5 g/dL — AB (ref 12.0–15.0)
MCH: 23.3 pg — ABNORMAL LOW (ref 26.0–34.0)
MCHC: 29.1 g/dL — AB (ref 30.0–36.0)
MCV: 80 fL (ref 80.0–100.0)
Platelets: 280 10*3/uL (ref 150–400)
RBC: 4.51 MIL/uL (ref 3.87–5.11)
RDW: 15.9 % — ABNORMAL HIGH (ref 11.5–15.5)
WBC: 7.6 10*3/uL (ref 4.0–10.5)
nRBC: 0 % (ref 0.0–0.2)

## 2018-08-23 MED ORDER — SACCHAROMYCES BOULARDII 250 MG PO CAPS
250.0000 mg | ORAL_CAPSULE | Freq: Two times a day (BID) | ORAL | Status: DC
  Administered 2018-08-23 – 2018-08-25 (×5): 250 mg via ORAL
  Filled 2018-08-23 (×5): qty 1

## 2018-08-23 NOTE — Progress Notes (Signed)
Pt stated she does not want to wear CPAP for the night.  

## 2018-08-23 NOTE — Progress Notes (Addendum)
CC:  Abdominal pain  Subjective: Patient was seen and I are yesterday, and there is no safe window to place a drain. He still has some pain in the midportion of the lower abdomen below the umbilicus, and some on the left side.  It sounds like she is still having loose stools.  She had some full liquids and is going to a soft diet this a.m.  She reports she is visiting from [place] and has an [place] [facility] doctor in [place] where she normally lives.   Objective: Vital signs in last 24 hours: Temp:  [97.7 F (36.5 C)-98.2 F (36.8 C)] 97.7 F (36.5 C) (11/05 0458) Pulse Rate:  [61-66] 62 (11/05 0458) Resp:  [17-18] 17 (11/05 0458) BP: (137-175)/(79-90) 148/89 (11/05 0458) SpO2:  [100 %] 100 % (11/05 0458) Last BM Date: 08/22/18 350 PO ~300 IV Urine x 1 recorded BM x 1 recorded Afebrile, VSS No labs this a.m. CT 08/22/2018:No safe percutaneous window was available to allow percutaneous drainage of the pelvic inflammatory process under CT guidance due to overlying small bowel and colon  Intake/Output from previous day: 11/04 0701 - 11/05 0700 In: 757.6 [P.O.:350; I.V.:297.4; IV Piggyback:110.2] Out: -  Intake/Output this shift: No intake/output data recorded.  General appearance: alert, cooperative and no distress Resp: clear to auscultation bilaterally GI: Soft positive bowel sounds.  Her tenderness is over the midportion of the abdomen just below the umbilicus, with some pain left lower quadrant also.  None of this seems to be severe pain but she can feel it.  Lab Results:  Recent Labs    08/21/18 0438 08/22/18 0532  WBC 8.6 7.3  HGB 9.8* 9.8*  HCT 33.5* 33.5*  PLT 251 266    BMET Recent Labs    08/22/18 0532  NA 137  K 3.3*  CL 107  CO2 24  GLUCOSE 101*  BUN <5*  CREATININE 0.70  CALCIUM 8.7*   PT/INR Recent Labs    08/22/18 0532  LABPROT 13.6  INR 1.05    Recent Labs  Lab 08/17/18 1511 08/22/18 0532  AST 17 28  ALT 14 28  ALKPHOS 94 158*   BILITOT 0.6 0.5  PROT 7.3 6.8  ALBUMIN 3.7 2.9*     Lipase     Component Value Date/Time   LIPASE 23 08/17/2018 1511     Medications: . acetaminophen  1,000 mg Oral Q6H  . busPIRone  7.5 mg Oral BID  . citalopram  10 mg Oral Daily  . enoxaparin (LOVENOX) injection  40 mg Subcutaneous Q24H  . lisinopril  10 mg Oral Daily  . mouth rinse  15 mL Mouth Rinse BID  . methocarbamol  1,000 mg Oral Q8H  . nebivolol  20 mg Oral BID  . traZODone  50 mg Oral QHS   Anti-infectives (From admission, onward)   Start     Dose/Rate Route Frequency Ordered Stop   08/18/18 1800  piperacillin-tazobactam (ZOSYN) IVPB 3.375 g     3.375 g 12.5 mL/hr over 240 Minutes Intravenous Every 8 hours 08/18/18 1354     08/18/18 0600  piperacillin-tazobactam (ZOSYN) IVPB 3.375 g  Status:  Discontinued     3.375 g 12.5 mL/hr over 240 Minutes Intravenous Every 8 hours 08/17/18 2248 08/18/18 1354   08/18/18 0300  piperacillin-tazobactam (ZOSYN) IVPB 3.375 g  Status:  Discontinued     3.375 g 12.5 mL/hr over 240 Minutes Intravenous Every 8 hours 08/17/18 2031 08/18/18 1353   08/17/18 2015  piperacillin-tazobactam (ZOSYN) IVPB 3.375 g     3.375 g 100 mL/hr over 30 Minutes Intravenous  Once 08/17/18 2006 08/17/18 2112   08/17/18 2015  metroNIDAZOLE (FLAGYL) IVPB 500 mg     500 mg 100 mL/hr over 60 Minutes Intravenous  Once 08/17/18 2006 08/17/18 2220      Assessment/Plan  Hx CAD/angioplasty - Daily Plavix last dose 08/17/18 [place] Hx Type 2 diabetes  - glucose essentially normal here  Hypertension BMI 48.5 Hypokalemia - mag pending replacing K+ via IV fluid   Acute perforated appendicitis with phlegmon -afebrile, VSS,WBC 14,700 from15,500 - CT 10/30: concerning for PID but wet prep negative and patient denies sexual activity - CT 11/3: Enlarging 4.8 cm loculated fluid collections that abuts the cecum as well as the                   right adnexa/anterior fundus of the uterus. - IR evaluating for  Drain 11/4 - No safe window per Dr. Kathlene Cote    UYZ:JQDU Mod diet/ IV fluids @ 50 ml/hr ID: Zosyn 10/30  =>> day 7/14 VTE: SCD's, Lovenox  Plan: I am going to recheck her labs this a.m.  We are advancing her to a soft diet.  If she continues to do well we will aim to convert her over to p.o. antibiotics.  If she remains stable discharge to home and follow-up as an outpatient, here or in [place].  Add a probiotic. Still tender and it will be a month before she can go to [place].   Recheck labs is AM, still tender.   LOS: 6 days    Megan Hayduk 08/23/2018 [phone]

## 2018-08-24 LAB — BASIC METABOLIC PANEL
ANION GAP: 7 (ref 5–15)
BUN: 6 mg/dL (ref 6–20)
CO2: 25 mmol/L (ref 22–32)
Calcium: 8.8 mg/dL — ABNORMAL LOW (ref 8.9–10.3)
Chloride: 107 mmol/L (ref 98–111)
Creatinine, Ser: 0.78 mg/dL (ref 0.44–1.00)
GFR calc Af Amer: >60 mL/min (ref >60)
Glucose, Bld: 120 mg/dL — ABNORMAL HIGH (ref 70–99)
POTASSIUM: 4 mmol/L (ref 3.5–5.1)
Sodium: 139 mmol/L (ref 135–145)

## 2018-08-24 LAB — GLUCOSE, CAPILLARY
GLUCOSE-CAPILLARY: 122 mg/dL — AB (ref 70–99)
GLUCOSE-CAPILLARY: 95 mg/dL (ref 70–99)
Glucose-Capillary: 119 mg/dL — ABNORMAL HIGH (ref 70–99)
Glucose-Capillary: 127 mg/dL — ABNORMAL HIGH (ref 70–99)

## 2018-08-24 LAB — CBC
HEMATOCRIT: 33.6 % — AB (ref 36.0–46.0)
HEMOGLOBIN: 9.6 g/dL — AB (ref 12.0–15.0)
MCH: 23.1 pg — ABNORMAL LOW (ref 26.0–34.0)
MCHC: 28.6 g/dL — ABNORMAL LOW (ref 30.0–36.0)
MCV: 81 fL (ref 80.0–100.0)
NRBC: 0 % (ref 0.0–0.2)
Platelets: 275 10*3/uL (ref 150–400)
RBC: 4.15 MIL/uL (ref 3.87–5.11)
RDW: 16.2 % — ABNORMAL HIGH (ref 11.5–15.5)
WBC: 6.9 10*3/uL (ref 4.0–10.5)

## 2018-08-24 NOTE — Progress Notes (Signed)
CC:  Abdominal pain  Subjective: Patient reports no pain this a.m.  This is new she did have pain yesterday.  She does still have some night sweats I asked her if she had night sweats prior to her abdominal pain but I do not think I know the answer to that.  She also reports she is getting married on November 12th, and was asking about flying.  It sounds like she is going someplace and then coming back to the [place] area.  Right now she has no complaints of any abdominal discomfort.  Objective: Vital signs in last 24 hours: Temp:  [97.4 F (36.3 C)-98.6 F (37 C)] 98 F (36.7 C) (11/06 0505) Pulse Rate:  [52-66] 57 (11/06 0505) Resp:  [16-18] 16 (11/06 0505) BP: (108-149)/(56-94) 137/90 (11/06 0505) SpO2:  [98 %-100 %] 98 % (11/06 0505) Last BM Date: 08/23/18 837 PO Urine x 2 recorded Stool x 2 recorded Afebrile, VSS WBC 6.9 BMP OK , K+ 4.0 Last CT 11/4:  No window for drain placement Intake/Output from previous day: 11/05 0701 - 11/06 0700 In: 837 [P.O.:837] Out: -  Intake/Output this shift: No intake/output data recorded.  General appearance: alert, cooperative and no distress Resp: clear to auscultation bilaterally GI: soft, non-tender; bowel sounds normal; no masses,  no organomegaly  Lab Results:  Recent Labs    08/23/18 1005 08/24/18 0140  WBC 7.6 6.9  HGB 10.5* 9.6*  HCT 36.1 33.6*  PLT 280 275    BMET Recent Labs    08/23/18 1005 08/24/18 0140  NA 136 139  K 3.9 4.0  CL 103 107  CO2 24 25  GLUCOSE 138* 120*  BUN <5* 6  CREATININE 0.69 0.78  CALCIUM 9.0 8.8*   PT/INR Recent Labs    08/22/18 0532  LABPROT 13.6  INR 1.05    Recent Labs  Lab 08/17/18 1511 08/22/18 0532  AST 17 28  ALT 14 28  ALKPHOS 94 158*  BILITOT 0.6 0.5  PROT 7.3 6.8  ALBUMIN 3.7 2.9*     Lipase     Component Value Date/Time   LIPASE 23 08/17/2018 1511     Medications: . acetaminophen  1,000 mg Oral Q6H  . busPIRone  7.5 mg Oral BID  .  citalopram  10 mg Oral Daily  . enoxaparin (LOVENOX) injection  40 mg Subcutaneous Q24H  . lisinopril  10 mg Oral Daily  . mouth rinse  15 mL Mouth Rinse BID  . methocarbamol  1,000 mg Oral Q8H  . nebivolol  20 mg Oral BID  . saccharomyces boulardii  250 mg Oral BID  . traZODone  50 mg Oral QHS    Assessment/Plan  Hx CAD/angioplasty - Daily Plavix last dose 08/17/18 [place] HxType 2 diabetes- glucose essentially normal here Hypertension BMI 48.5 Hypokalemia - mag pending replacing K+ via IV fluid   Acute perforated appendicitis with phlegmon -afebrile, VSS,WBC 14,700 from15,500 - CT10/30:concerning for PID but wet prep negative and patient denies sexual activity - CT 11/3:Enlarging 4.8 cm loculated fluid collections that abuts the cecum as well as the                   rightadnexa/anterior fundus of the uterus. - IR evaluating for Drain 11/4 - No safe window per Dr. Kathlene Cote    RSW:NIOE Mod diet/ IV fluids @ 50 ml/hr ID: Zosyn 10/30=>>day 8/14 VTE: SCD's, Lovenox  Plan: Continue the IV antibiotics for right now.  Put her back on  a carb mod diet.  We will discuss converting her over to oral antibiotics, and possible disposition.  We really need to get the sign language interpreter to help expedite and facilitate this.    LOS: 7 days    Carmen Moore 08/24/2018 [phone]

## 2018-08-24 NOTE — Discharge Summary (Signed)
Physician Discharge Summary  Patient ID: Carmen Moore MRN: [id number] DOB/AGE: 51-Apr-1967 51 y.o.  Admit date: 08/17/2018 Discharge date: 08/25/2018  Admission Diagnoses:   Acute appendiceal perforation with phlegmon versus right adnexal abscess. Hx CAD/angioplasty - Daily Plavix last dose 08/17/18 Deaf HxType 2 diabetes Hypertension BMI 48.5 ( Morbid/Severe obesity - BMI > 40.0) Hypokalemia -resolved  Discharge Diagnoses:  Same  Active Problems:   Ruptured appendicitis   PROCEDURES:  None  Hospital Course:   Patient is a 52 year old female who presented to the ED on 08/17/2018 with 2 days of abdominal pain, urinary frequency and burning.  Pain became more diffuse along the lower abdomen.  Work-up in the ED including CT scan showed acute appendicitis with rupture/phlegmon.  Based on his condition there was also concern that could be a possible PID.  Patient was admitted for IV antibiotics.  Of note she was visiting here from [place].  She has a history of stroke and takes Plavix which was placed on hold.  She made some improvement on IV antibiotics.  CT of the abdomen pelvis was repeated on 08/21/2018.  This showed an enlarging multiloculated tubular structure with enhancing walls in the ventral pelvis with adjacent 4.8 cm loculated fluid collection with surrounding inflammatory changes.  The process abuts the cecum as well as the right adnexa this was thought to represent a periappendiceal abscess, but adnexal source was not excluded.  She was seen and evaluated in IR by Dr. Kathlene Cote, it was his opinion there was no safe window for IR drain.  She was returned to the floor and is being kept on IV antibiotics.  Her WBC has been normal but she continued to have pain in her lower abdomen especially the midportion up until the a.m. of 08/24/18. She is now pain free, her pain is better, she is tolerating a diet, ambulating easily and wants to go home.  Follow up is proving to be difficult.  She  is from [place], she is visiting in [place], and plans to get Married on 08/30/18, She is then going to [place] on 11/12- 09/18/18.  WE plan another week of oral Augmentin.  I have arranged for her to see Dr. Hulen Skains on 08/29/18, before she get married and is off to [place].   She understands the risk of traveling with this fluid collection and possible abscess.    CBC Latest Ref Rng & Units 08/24/2018 08/23/2018 08/22/2018  WBC 4.0 - 10.5 K/uL 6.9 7.6 7.3  Hemoglobin 12.0 - 15.0 g/dL 9.6(L) 10.5(L) 9.8(L)  Hematocrit 36.0 - 46.0 % 33.6(L) 36.1 33.5(L)  Platelets 150 - 400 K/uL 275 280 266   CMP Latest Ref Rng & Units 08/24/2018 08/23/2018 08/22/2018  Glucose 70 - 99 mg/dL 120(H) 138(H) 101(H)  BUN 6 - 20 mg/dL 6 <5(L) <5(L)  Creatinine 0.44 - 1.00 mg/dL 0.78 0.69 0.70  Sodium 135 - 145 mmol/L 139 136 137  Potassium 3.5 - 5.1 mmol/L 4.0 3.9 3.3(L)  Chloride 98 - 111 mmol/L 107 103 107  CO2 22 - 32 mmol/L 25 24 24   Calcium 8.9 - 10.3 mg/dL 8.8(L) 9.0 8.7(L)  Total Protein 6.5 - 8.1 g/dL - - 6.8  Total Bilirubin 0.3 - 1.2 mg/dL - - 0.5  Alkaline Phos 38 - 126 U/L - - 158(H)  AST 15 - 41 U/L - - 28  ALT 0 - 44 U/L - - 28    CT scan 08/17/18:  1. Inflammation in the ventral pelvis from uncertain source. There is  a tubular structure central to the inflammation, which is adjacent to the cecum, such that appendicitis is a strong consideration and would likely be complicated by perforation. There is no appendicolith. The proximity to the uterus and indistinct adnexa also raises the possibility of PID with pyosalpinx. 2. Small left renal calculus. 3. Punctate stone along the urethra but no discrete urethral diverticulum.  CT abd/pelvis w contrast 08/21/18:  Enlarging multiloculated tubular structure with enhancing walls in the ventral pelvis, with adjacent 4.8 cm loculated fluid collection and surrounding inflammatory changes. The process abuts the cecum as well as the right adnexa and the  anterior fundus of the uterus, without fat plane separating it from any of these structures. If this represents an appendiceal abscess, the abscess is enlarging. No free gas within the abdomen.  CT pelvis with Contrast 08/22/18: FINDINGS: Loculations of fluid again noted adjacent to the uterus anteriorly and to the right of midline. Due to overlying small bowel and colon, there was no safe percutaneous window to allow a percutaneous drainage procedure under CT guidance. Follow-up CT of the abdomen and pelvis is recommended. IMPRESSION: No safe percutaneous window was available to allow percutaneous drainage of the pelvic inflammatory process under CT guidance due to overlying small bowel and colon.    Condition on DC:  Improving  Disposition:    Allergies as of 08/25/2018      Reactions   Tomato    Beef-derived Products Diarrhea   Nitroglycerin Itching   Pollen Extract    Latex Rash      Medication List    STOP taking these medications   famotidine 20 MG tablet Commonly known as:  PEPCID   HYDROcodone-acetaminophen 5-325 MG tablet Commonly known as:  NORCO/VICODIN   megestrol 20 MG tablet Commonly known as:  MEGACE   metoprolol tartrate 50 MG tablet Commonly known as:  LOPRESSOR   naproxen 500 MG tablet Commonly known as:  NAPROSYN   ondansetron 4 MG tablet Commonly known as:  ZOFRAN     TAKE these medications   acetaminophen 500 MG tablet Commonly known as:  TYLENOL Take 2 tablets (1,000 mg total) by mouth every 6 (six) hours as needed.   amoxicillin-clavulanate 875-125 MG tablet Commonly known as:  AUGMENTIN Take 1 tablet by mouth every 12 (twelve) hours for 7 days.   busPIRone 7.5 MG tablet Commonly known as:  BUSPAR Take 7.5 mg by mouth 2 (two) times daily.   BYSTOLIC 20 MG Tabs Generic drug:  Nebivolol HCl Take 20 mg by mouth 2 (two) times daily.   citalopram 10 MG tablet Commonly known as:  CELEXA Take 10 mg by mouth daily.    clopidogrel 75 MG tablet Commonly known as:  PLAVIX Take 75 mg by mouth daily.   ferrous sulfate 325 (65 FE) MG tablet Take 325 mg by mouth daily with breakfast.   levalbuterol 45 MCG/ACT inhaler Commonly known as:  XOPENEX HFA Inhale 2 puffs into the lungs every 6 (six) hours as needed for wheezing.   lisinopril 10 MG tablet Commonly known as:  PRINIVIL,ZESTRIL Take 1 tablet (10 mg total) by mouth daily.   loratadine 10 MG tablet Commonly known as:  CLARITIN Take 10 mg by mouth daily as needed for allergies.   metFORMIN 500 MG tablet Commonly known as:  GLUCOPHAGE You need to check your glucose at home and adjust your Metformin based on your glucose.  Your glucose has been low to normal here in the hospital; so I have restarted  you on 1/2 dose of Metformin for now. Check with your, glucose at home and talk with your Primary Care doctor about going back to full dose. What changed:    how much to take  how to take this  when to take this  additional instructions   oxyCODONE 5 MG immediate release tablet Commonly known as:  Oxy IR/ROXICODONE If you have severe pain you can take one every 6 hours until you see a physician.   saccharomyces boulardii 250 MG capsule Commonly known as:  FLORASTOR This is a probiotic to help replace the good bacteria the antibiotic are affecting.  You can buy over the counter at any drug store, and take for the next month. Follow the package directions.   traZODone 50 MG tablet Commonly known as:  DESYREL Take 50 mg by mouth at bedtime.      Follow-up Information    Judeth Horn, MD Follow up on 08/29/2018.   Specialty:  General Surgery Why:  Be at the office at [facility] to check in for a 9:20 AM visit with Dr. Hulen Skains.  This is the only time I can get you in before you go to [place]. Contact information: [address] [place] [address] [phone]        Dr. Denman George Molinaris Follow up.   Contact information: [phone]  - office number  [phone] Office Fax number          Signed: Earnstine Regal 08/25/2018, 1:58 PM

## 2018-08-25 LAB — GLUCOSE, CAPILLARY
Glucose-Capillary: 104 mg/dL — ABNORMAL HIGH (ref 70–99)
Glucose-Capillary: 89 mg/dL (ref 70–99)

## 2018-08-25 MED ORDER — METFORMIN HCL 500 MG PO TABS: ORAL_TABLET | ORAL | Status: AC

## 2018-08-25 MED ORDER — AMOXICILLIN-POT CLAVULANATE 875-125 MG PO TABS: 1.0000 | ORAL_TABLET | Freq: Two times a day (BID) | ORAL | 0 refills | Status: AC

## 2018-08-25 MED ORDER — SACCHAROMYCES BOULARDII 250 MG PO CAPS: ORAL_CAPSULE | ORAL | Status: AC

## 2018-08-25 MED ORDER — METFORMIN HCL 500 MG PO TABS
500.0000 mg | ORAL_TABLET | Freq: Two times a day (BID) | ORAL | Status: DC
  Administered 2018-08-25: 500 mg via ORAL
  Filled 2018-08-25: qty 1

## 2018-08-25 MED ORDER — AMOXICILLIN-POT CLAVULANATE 875-125 MG PO TABS
1.0000 | ORAL_TABLET | Freq: Two times a day (BID) | ORAL | Status: DC
  Administered 2018-08-25: 1 via ORAL
  Filled 2018-08-25: qty 1

## 2018-08-25 MED ORDER — OXYCODONE HCL 5 MG PO TABS: ORAL_TABLET | ORAL | 0 refills | Status: AC

## 2018-08-25 MED ORDER — ACETAMINOPHEN 500 MG PO TABS: 1000.0000 mg | ORAL_TABLET | Freq: Four times a day (QID) | ORAL | 0 refills | Status: AC | PRN

## 2018-08-25 NOTE — Progress Notes (Signed)
XN:[id number] pain   Subjective: Patient continues to feel better she has minimal discomfort she is taking some Tylenol and Robaxin for.  It sounds like the pain occurs when she is trying to avoid or use the bathroom.  Her plans include getting married on November 12 and then going to [place] and coming back December 8.  She is going to talk with her fianc and see what they can do about arrangements so she can get follow-up sooner.  She would like to go, she is tolerating a heart healthy diet and having bowel movements without issue.  Objective: Vital signs in last 24 hours: Temp:  [98.1 F (36.7 C)-98.4 F (36.9 C)] 98.2 F (36.8 C) (11/07 0513) Pulse Rate:  [66-73] 73 (11/07 0513) Resp:  [14-22] 14 (11/07 0513) BP: (130-142)/(76-86) 142/76 (11/07 0513) SpO2:  [99 %-100 %] 100 % (11/07 0513) Last BM Date: 08/24/18 980 PO recorded 2400 IV Urine x 5  BM x 4 Afebrile, VSS   Intake/Output from previous day: 11/06 0701 - 11/07 0700 In: 3489.6 [P.O.:980; I.V.:2097.2; IV Piggyback:412.5] Out: -  Intake/Output this shift: No intake/output data recorded.  General appearance: alert, cooperative and no distress Resp: clear to auscultation bilaterally GI: soft nontender, + BS, and BM, she says she feels some discomfort with voiding.   Lab Results:  Recent Labs    08/23/18 1005 08/24/18 0140  WBC 7.6 6.9  HGB 10.5* 9.6*  HCT 36.1 33.6*  PLT 280 275    BMET Recent Labs    08/23/18 1005 08/24/18 0140  NA 136 139  K 3.9 4.0  CL 103 107  CO2 24 25  GLUCOSE 138* 120*  BUN <5* 6  CREATININE 0.69 0.78  CALCIUM 9.0 8.8*   PT/INR No results for input(s): LABPROT, INR in the last 72 hours.  Recent Labs  Lab 08/22/18 0532  AST 28  ALT 28  ALKPHOS 158*  BILITOT 0.5  PROT 6.8  ALBUMIN 2.9*     Lipase     Component Value Date/Time   LIPASE 23 08/17/2018 1511     Medications: . acetaminophen  1,000 mg Oral Q6H  . busPIRone  7.5 mg Oral BID  . citalopram   10 mg Oral Daily  . enoxaparin (LOVENOX) injection  40 mg Subcutaneous Q24H  . lisinopril  10 mg Oral Daily  . mouth rinse  15 mL Mouth Rinse BID  . methocarbamol  1,000 mg Oral Q8H  . nebivolol  20 mg Oral BID  . saccharomyces boulardii  250 mg Oral BID  . traZODone  50 mg Oral QHS    Assessment/Plan Hx CAD/angioplasty - Daily Plavix last dose 08/17/18 Deaf HxType 2 diabetes- glucose essentially normal here Hypertension BMI 48.5 Hypokalemia - mag pending replacing K+ via IV fluid   Acute perforated appendicitis with phlegmon vs adnexal infection -afebrile, VSS,WBC 14,700 from15,500 - CT10/30:concerning for PID but wet prep negative and patient denies sexual activity - CT 11/3:Enlarging 4.8 cm loculated fluid collections that abuts the cecum as well as the rightadnexa/anterior fundus of the uterus. - IR evaluating for Drain11/4 - No safe window per Dr. Kathlene Cote    URK:YHCW Mod diet/ IV fluids @ 50 ml/hr ID: Zosyn 10/30- 11/; Augmentin 11/7 -- day9/14 of IV/oral abx VTE: SCD's, Lovenox  Plan: We will saline lock her IVs, restart her metformin at a lower dose.  Switch her over to oral Augmentin.  We are going to let her talk to her family and her fianc  to see what type of arrangements she wants to make for follow-up.  Going to recheck with her after lunch and see how she is doing and what she would like to do.  We use the sign language translator during this discussion.  I will recheck with her after lunch and see what kind of arrangement she wants to make.        LOS: 8 days    Carmen Moore 08/25/2018 [phone]

## 2018-08-25 NOTE — Progress Notes (Signed)
Patient has refused CPAP hs tonight.

## 2018-08-25 NOTE — Progress Notes (Signed)
Alfonso Patten to be D/C'd  per MD order. Discussed with the patient and all questions fully answered.  VSS, Skin clean, dry and intact without evidence of skin break down, no evidence of skin tears noted.  IV catheter discontinued intact. Site without signs and symptoms of complications. Dressing and pressure applied.  An After Visit Summary was printed and given to the patient. Patient received prescription.  D/c education completed with patient/family including follow up instructions, medication list, d/c activities limitations if indicated, with other d/c instructions as indicated by MD - patient able to verbalize understanding with ASL sign language interpreter, all questions fully answered.   Patient instructed to return to ED, call 911, or call MD for any changes in condition.   Patient to be escorted via [place], and D/C home via private auto.  [place] Lanae Boast, RN

## 2018-08-25 NOTE — Discharge Instructions (Signed)
Appendicitis  You have a fluid collection that is between your appendix and your ovary.  We are treating your for this.  Follow up is normally in 1-2 weeks.  If you have fever, increased pain, nausea, discomfort voiding, or you just feel bad, you need to be checked out by a local physician where ever you are. If in [facility] call Dr. Richarda Blade office The appendix is a tube that is shaped like a finger. It is connected to the large intestine. Appendicitis means that this tube is swollen (inflamed). Without treatment, the tube can tear (rupture). This can lead to a life-threatening infection. It can also cause you to have sores (abscesses). These sores hurt. What are the causes? This condition may be caused by something that blocks the appendix, such as:  A ball of poop (stool).  Lymph glands that are bigger than normal.  Sometimes, the cause is not known. What are the signs or symptoms? Symptoms of this condition include:  Pain around the belly button (navel). ? The pain moves toward the lower right belly (abdomen). ? The pain can get worse with time. ? The pain can get worse if you cough. ? The pain can get worse if you move suddenly.  Tenderness in the lower right belly.  Feeling sick to your stomach (nauseous).  Throwing up (vomiting).  Not feeling hungry (loss of appetite).  A fever.  Having a hard time pooping (constipation).  Watery poop (diarrhea).  Not feeling well.  How is this treated? Usually, this condition is treated by taking out the appendix (appendectomy). There are two ways that the appendix can be taken out:  Open surgery. In this surgery, the appendix is taken out through a large cut (incision). The cut is made in the lower right belly. This surgery may be picked if: ? You have scars from another surgery. ? You have a bleeding condition. ? You are pregnant and will be having your baby soon. ? You have a condition that does not allow the other type of  surgery.  Laparoscopic surgery. In this surgery, the appendix is taken out through small cuts. Often, this surgery: ? Causes less pain. ? Causes fewer problems. ? Is easier to heal from.  If your appendix tears and a sore forms:  A drain may be put into the sore. The drain will be used to get rid of fluid.  You may get an antibiotic medicine through an IV tube.  Your appendix may or may not need to be taken out.  This information is not intended to replace advice given to you by your health care provider. Make sure you discuss any questions you have with your health care provider. Document Released: 12/28/2011 Document Revised: 03/12/2016 Document Reviewed: 02/20/2015 Elsevier Interactive Patient Education  2018 [employer].   Diabetes Mellitus and Nutrition  You have been off your Metformin in the hospital till day of discharge because your glucose has been normal without it.  NOW that your are eating again you need to check your glucose at home,  Record those readings, then contact your Primary Care doctor and let them adjust you back to your normal dose of Metformin when it is safe to do so.  Keeping your glucose well controlled with help with the infection also.     When you have diabetes (diabetes mellitus), it is very important to have healthy eating habits because your blood sugar (glucose) levels are greatly affected by what you eat and drink. Eating healthy  foods in the appropriate amounts, at about the same times every day, can help you:  Control your blood glucose.  Lower your risk of heart disease.  Improve your blood pressure.  Reach or maintain a healthy weight.  Every person with diabetes is different, and each person has different needs for a meal plan. Your health care provider may recommend that you work with a diet and nutrition specialist (dietitian) to make a meal plan that is best for you. Your meal plan may vary depending on factors such as:  The calories  you need.  The medicines you take.  Your weight.  Your blood glucose, blood pressure, and cholesterol levels.  Your activity level.  Other health conditions you have, such as heart or kidney disease.  How do carbohydrates affect me? Carbohydrates affect your blood glucose level more than any other type of food. Eating carbohydrates naturally increases the amount of glucose in your blood. Carbohydrate counting is a method for keeping track of how many carbohydrates you eat. Counting carbohydrates is important to keep your blood glucose at a healthy level, especially if you use insulin or take certain oral diabetes medicines. It is important to know how many carbohydrates you can safely have in each meal. This is different for every person. Your dietitian can help you calculate how many carbohydrates you should have at each meal and for snack. Foods that contain carbohydrates include:  Bread, cereal, rice, pasta, and crackers.  Potatoes and corn.  Peas, beans, and lentils.  Milk and yogurt.  Fruit and juice.  Desserts, such as cakes, cookies, ice cream, and candy.  How does alcohol affect me? Alcohol can cause a sudden decrease in blood glucose (hypoglycemia), especially if you use insulin or take certain oral diabetes medicines. Hypoglycemia can be a life-threatening condition. Symptoms of hypoglycemia (sleepiness, dizziness, and confusion) are similar to symptoms of having too much alcohol. If your health care provider says that alcohol is safe for you, follow these guidelines:  Limit alcohol intake to no more than 1 drink per day for nonpregnant women and 2 drinks per day for men. One drink equals 12 oz of beer, 5 oz of wine, or 1 oz of hard liquor.  Do not drink on an empty stomach.  Keep yourself hydrated with water, diet soda, or unsweetened iced tea.  Keep in mind that regular soda, juice, and other mixers may contain a lot of sugar and must be counted as  carbohydrates.  What are tips for following this plan? Reading food labels  Start by checking the serving size on the label. The amount of calories, carbohydrates, fats, and other nutrients listed on the label are based on one serving of the food. Many foods contain more than one serving per package.  Check the total grams (g) of carbohydrates in one serving. You can calculate the number of servings of carbohydrates in one serving by dividing the total carbohydrates by 15. For example, if a food has 30 g of total carbohydrates, it would be equal to 2 servings of carbohydrates.  Check the number of grams (g) of saturated and trans fats in one serving. Choose foods that have low or no amount of these fats.  Check the number of milligrams (mg) of sodium in one serving. Most people should limit total sodium intake to less than 2,300 mg per day.  Always check the nutrition information of foods labeled as "low-fat" or "nonfat". These foods may be higher in added sugar or refined  carbohydrates and should be avoided.  Talk to your dietitian to identify your daily goals for nutrients listed on the label. Shopping  Avoid buying canned, premade, or processed foods. These foods tend to be high in fat, sodium, and added sugar.  Shop around the outside edge of the grocery store. This includes fresh fruits and vegetables, bulk grains, fresh meats, and fresh dairy. Cooking  Use low-heat cooking methods, such as baking, instead of high-heat cooking methods like deep frying.  Cook using healthy oils, such as olive, canola, or sunflower oil.  Avoid cooking with butter, cream, or high-fat meats. Meal planning  Eat meals and snacks regularly, preferably at the same times every day. Avoid going long periods of time without eating.  Eat foods high in fiber, such as fresh fruits, vegetables, beans, and whole grains. Talk to your dietitian about how many servings of carbohydrates you can eat at each  meal.  Eat 4-6 ounces of lean protein each day, such as lean meat, chicken, fish, eggs, or tofu. 1 ounce is equal to 1 ounce of meat, chicken, or fish, 1 egg, or 1/4 cup of tofu.  Eat some foods each day that contain healthy fats, such as avocado, nuts, seeds, and fish. Lifestyle   Check your blood glucose regularly.  Exercise at least 30 minutes 5 or more days each week, or as told by your health care provider.  Take medicines as told by your health care provider.  Do not use any products that contain nicotine or tobacco, such as cigarettes and e-cigarettes. If you need help quitting, ask your health care provider.  Work with a Social worker or diabetes educator to identify strategies to manage stress and any emotional and social challenges. What are some questions to ask my health care provider?  Do I need to meet with a diabetes educator?  Do I need to meet with a dietitian?  What number can I call if I have questions?  When are the best times to check my blood glucose? Where to find more information:  American Diabetes Association: diabetes.org/food-and-fitness/food  Academy of Nutrition and Dietetics: PokerClues.dk  [employer] of Diabetes and Digestive and Kidney Diseases (NIH): ContactWire.be Summary  A healthy meal plan will help you control your blood glucose and maintain a healthy lifestyle.  Working with a diet and nutrition specialist (dietitian) can help you make a meal plan that is best for you.  Keep in mind that carbohydrates and alcohol have immediate effects on your blood glucose levels. It is important to count carbohydrates and to use alcohol carefully. This information is not intended to replace advice given to you by your health care provider. Make sure you discuss any questions you have with your health care provider. Document  Released: 07/02/2005 Document Revised: 11/09/2016 Document Reviewed: 11/09/2016 Elsevier Interactive Patient Education  [employer].

## 2020-09-14 IMAGING — CT CT ABD-PELV W/ CM
2 of 5 series · 16 of 46 positions shown, 18 images · IV contrast (Omni 300)
Comparison: 08/17/2018

CLINICAL DATA: Drained appendicitis.

EXAM:
CT ABDOMEN AND PELVIS WITH CONTRAST
TECHNIQUE: Multidetector CT imaging of the abdomen and pelvis was performed
using the standard protocol following bolus administration of
intravenous contrast.
CONTRAST:  100mL OMNIPAQUE IOHEXOL 300 MG/ML  SOLN

[Series 3: a/p w/ 5mm · axial · 0.80mm/px · z∈[+900,+1306]mm · 13 of 91 slices shown, 15 images]
[im 5/91  soft-tissue]
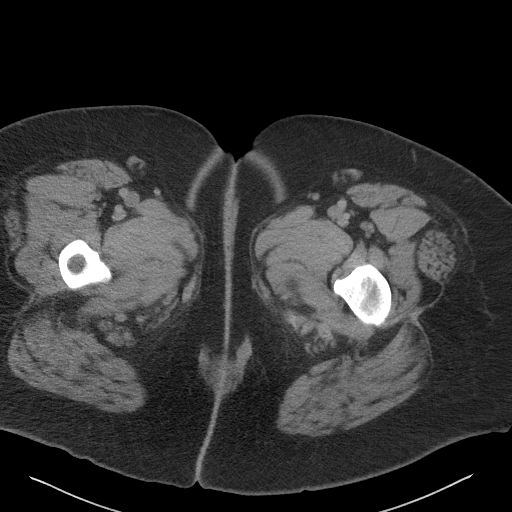
[im 5/91  bone]
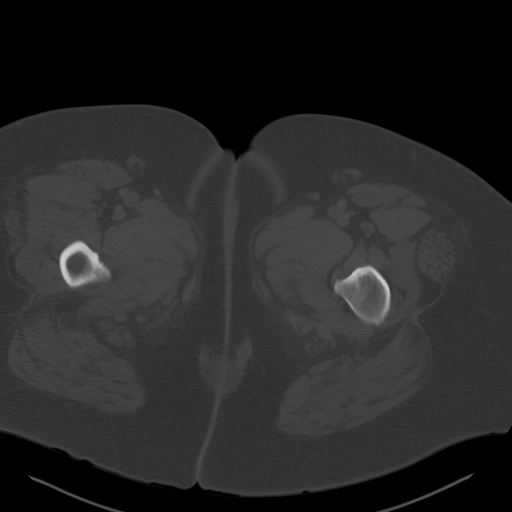
[im 15/91  soft-tissue]
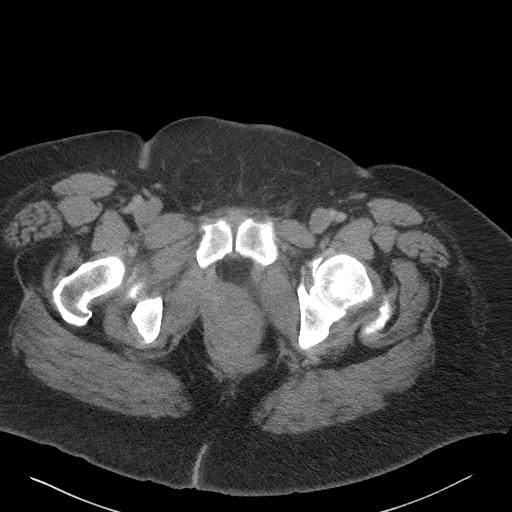
[im 19/91  soft-tissue]
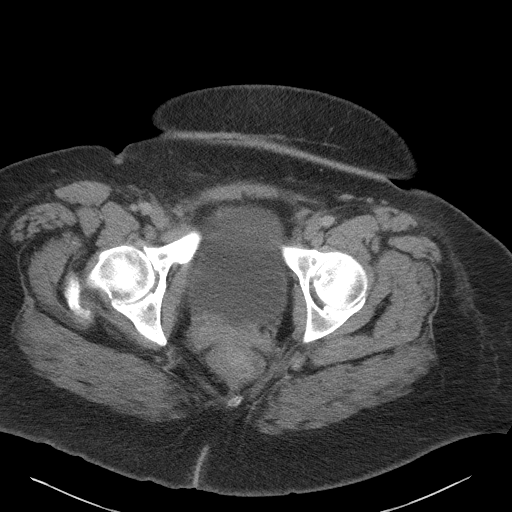
[im 24/91  soft-tissue]
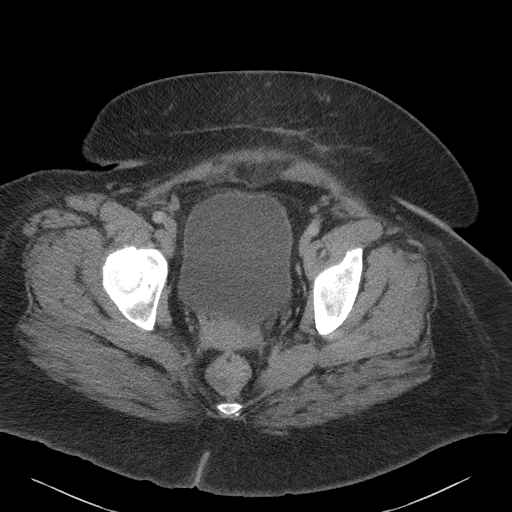
[im 34/91  soft-tissue]
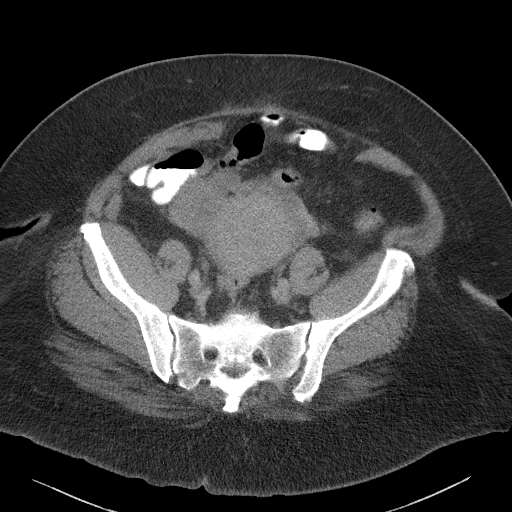
[im 38/91  soft-tissue]
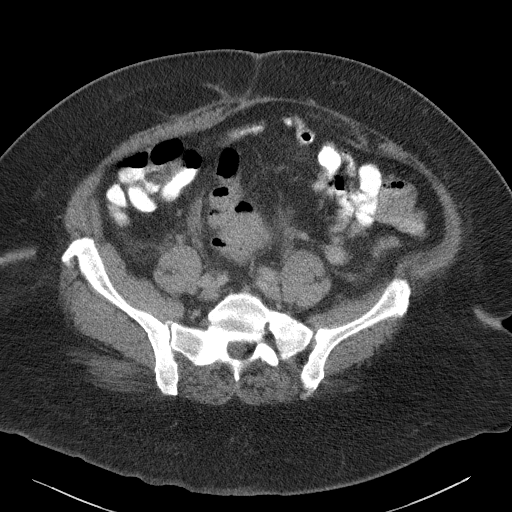
[im 48/91  soft-tissue]
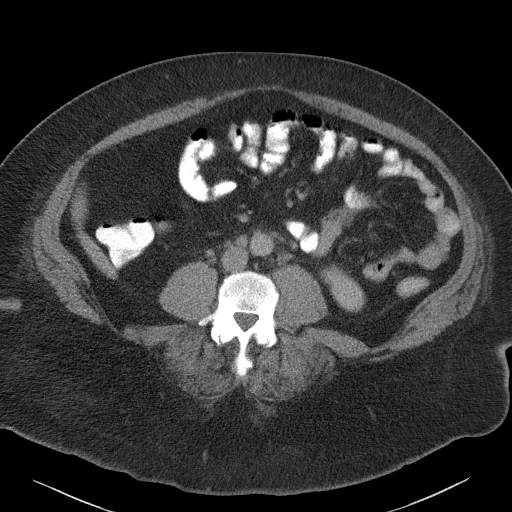
[im 53/91  soft-tissue]
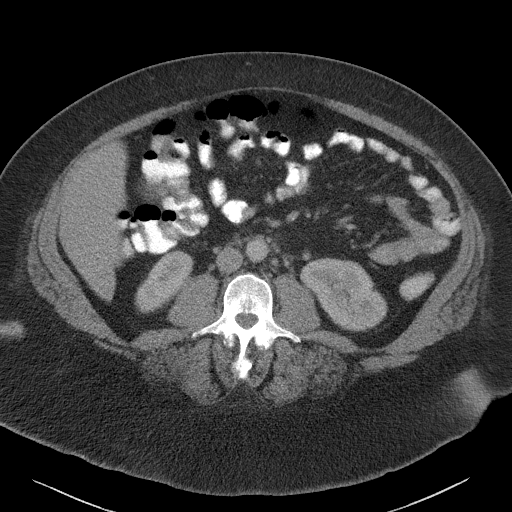
[im 57/91  soft-tissue]
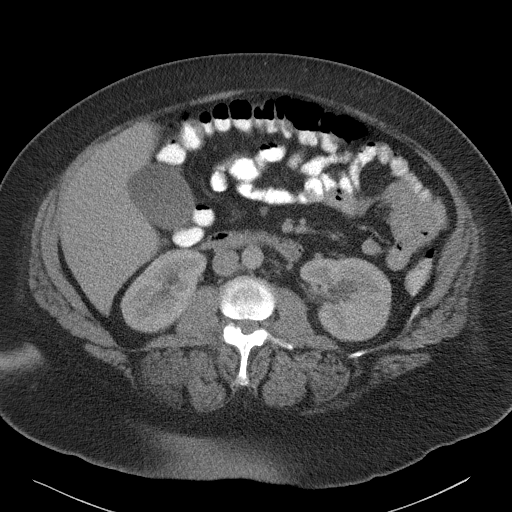
[im 57/91  bone]
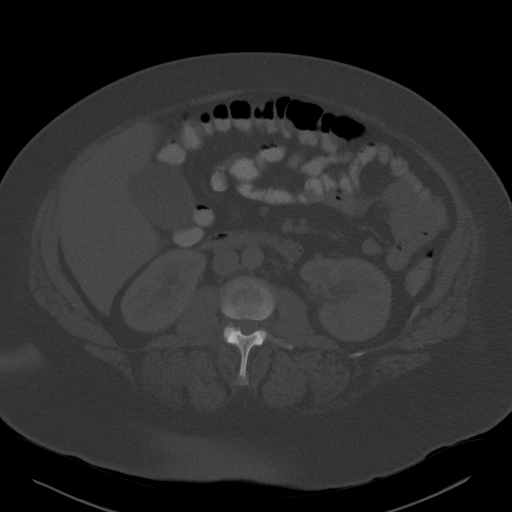
[im 67/91  soft-tissue]
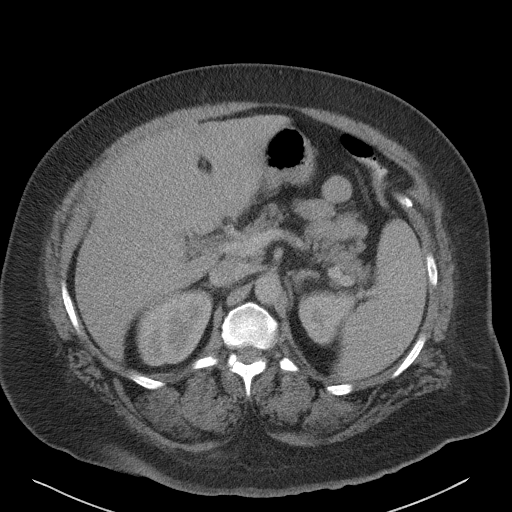
[im 72/91  soft-tissue]
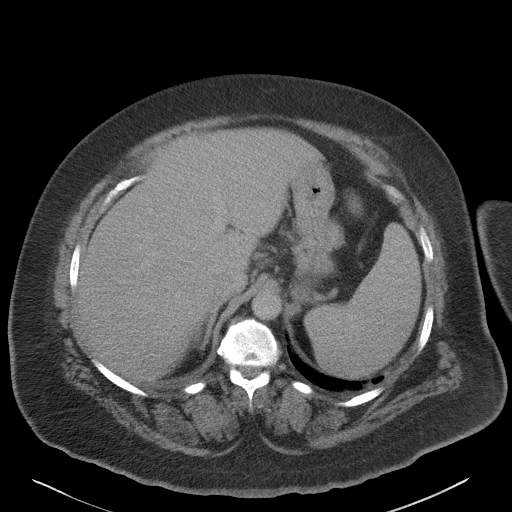
[im 76/91  soft-tissue]
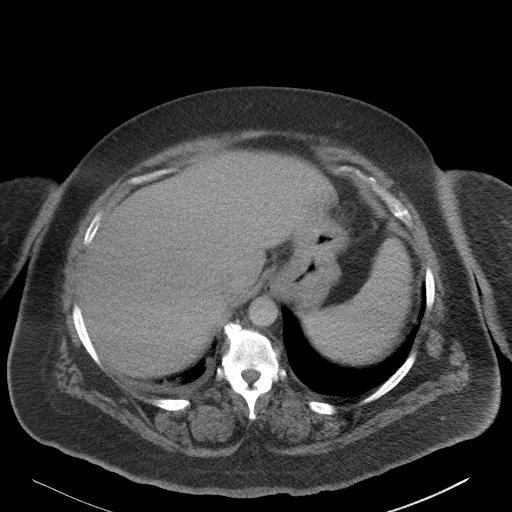
[im 86/91  soft-tissue]
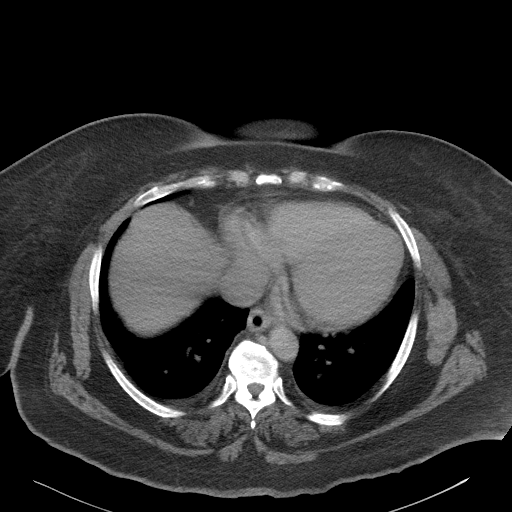

[Series 8: a/p w/ cor · coronal · 0.83mm/px · 3 of 177 slices shown]
[im 59/177  soft-tissue]
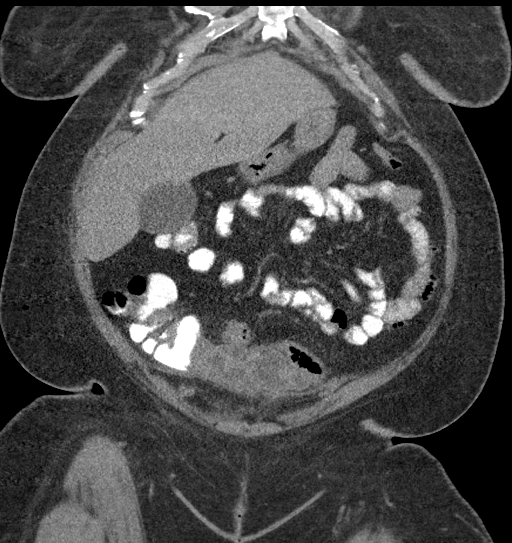
[im 79/177  soft-tissue]
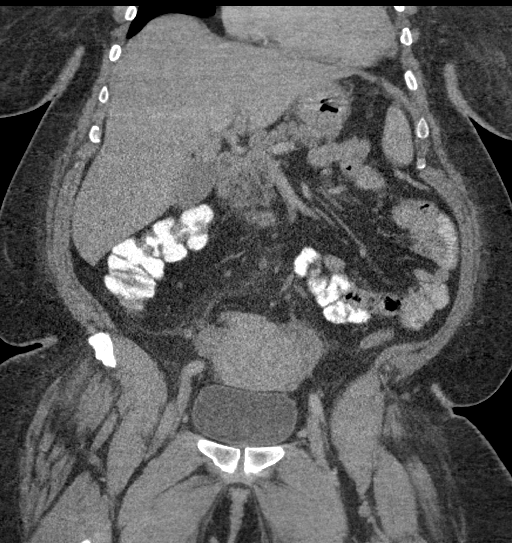
[im 98/177  soft-tissue]
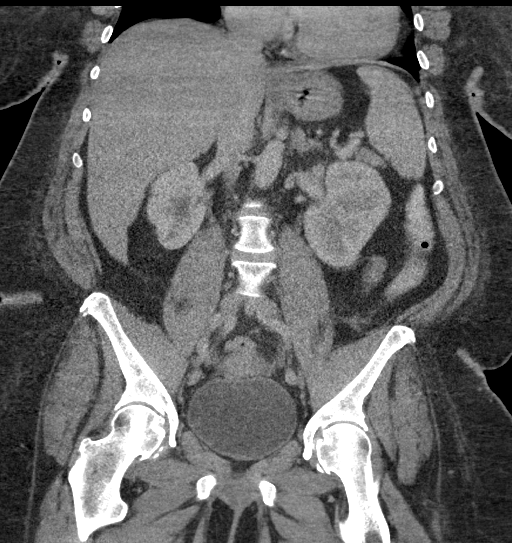

[16 of 46 positions shown; findings below may reference images not displayed]

FINDINGS: Lower chest: No acute abnormality.

Hepatobiliary: No focal liver abnormality is seen. No gallstones,
gallbladder wall thickening, or biliary dilatation.

Pancreas: Unremarkable. No pancreatic ductal dilatation or
surrounding inflammatory changes.

Spleen: Normal in size without focal abnormality.

Adrenals/Urinary Tract: Adrenal glands are unremarkable. Kidneys are
normal, without renal calculi, focal lesion, or hydronephrosis.
Bladder is unremarkable.

Stomach/Bowel: Normal appearance of the stomach and small bowel.
Scattered colonic diverticulosis without evidence of diverticulitis.
The previously demonstrated inflammation within the ventral pelvis
persists with enlarging multiloculated tubular structure with wall
enhancement central to it. Isodense fluid collection posterior to
the cecum measures 4.8 x 3.9 cm and communicates with the tubular
structure. The process abuts the right adnexa and fundus of the
uterus without fat plane separating these structures from it.

Vascular/Lymphatic: No significant vascular findings are present. No
enlarged abdominal or pelvic lymph nodes. Sub pathologic likely
reactive mesenteric lymph nodes in the right lower quadrant of the
abdomen.

Other: Left parasagittal infraumbilical fat containing ventral
abdominal wall hernia.

Musculoskeletal: No acute or significant osseous findings.
IMPRESSION: Enlarging multiloculated tubular structure with enhancing walls in
the ventral pelvis, with adjacent 4.8 cm loculated fluid collection
and surrounding inflammatory changes. The process abuts the cecum as
well as the right adnexa and the anterior fundus of the uterus,
without fat plane separating it from any of these structures. If
this represents an appendiceal abscess, the abscess is enlarging.

No free gas within the abdomen.

## 2020-09-15 IMAGING — CT CT PELVIS W/O CM
1 series · 16 of 32 positions shown, 20 images · non-contrast
Comparison: 08/21/2018 and 08/17/2018.

CLINICAL DATA: Inflammatory process of the pelvis. The patient
presents for possible percutaneous drainage.

EXAM:
CT PELVIS WITHOUT CONTRAST
TECHNIQUE: Multidetector CT imaging of the pelvis was performed following the
standard protocol without intravenous contrast.

[Series 2: i-spiral 5.0 b40f · axial · 0.96mm/px · z∈[+1153,+1328]mm · 16 of 56 slices shown, 20 images]
[im 4/56  soft-tissue]
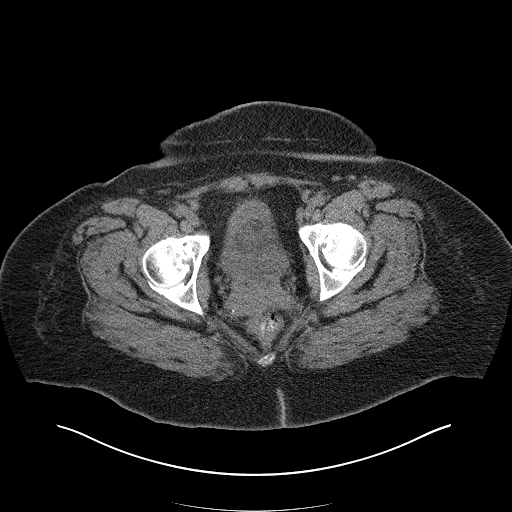
[im 4/56  bone]
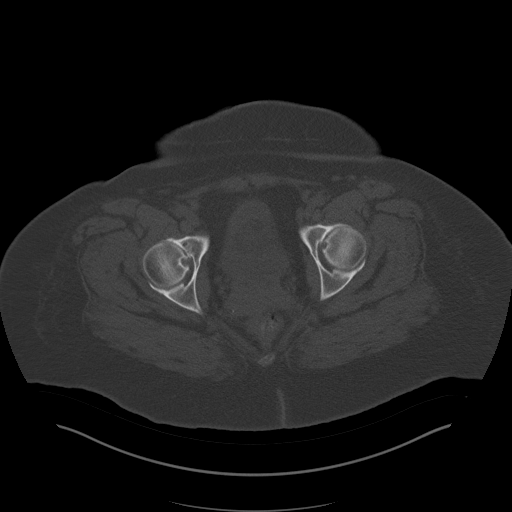
[im 8/56  soft-tissue]
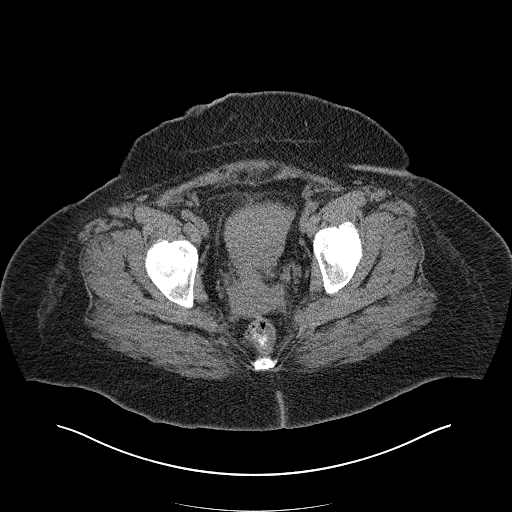
[im 11/56  soft-tissue]
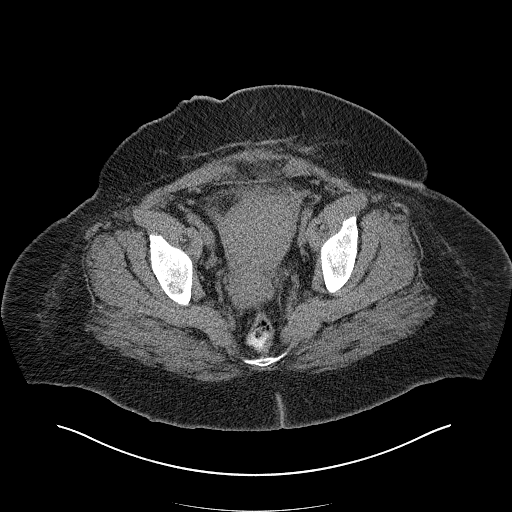
[im 15/56  soft-tissue]
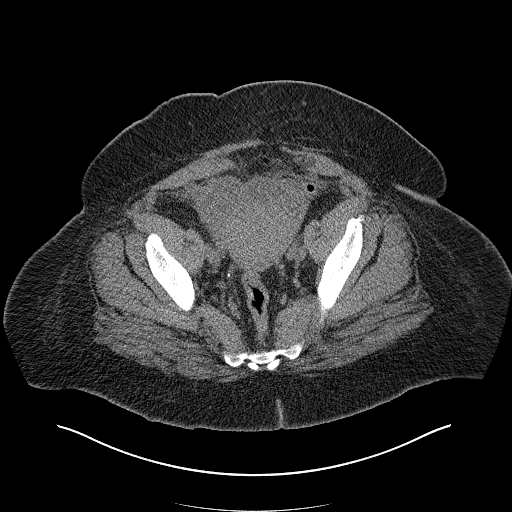
[im 18/56  soft-tissue]
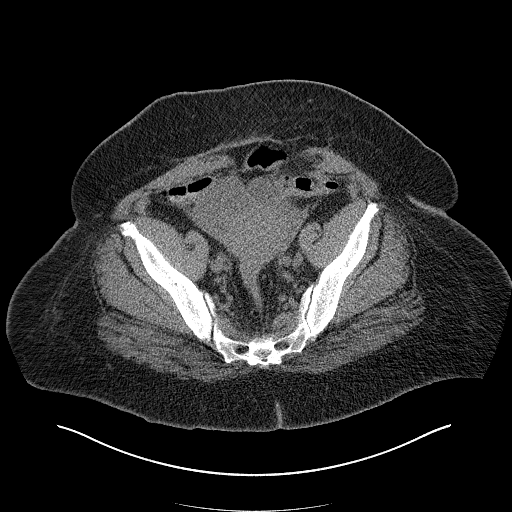
[im 22/56  soft-tissue]
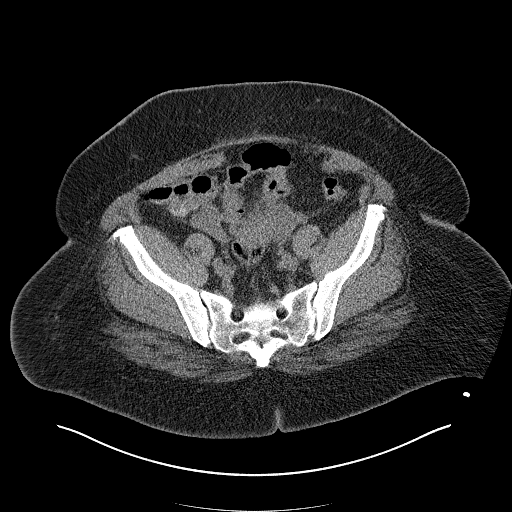
[im 25/56  soft-tissue]
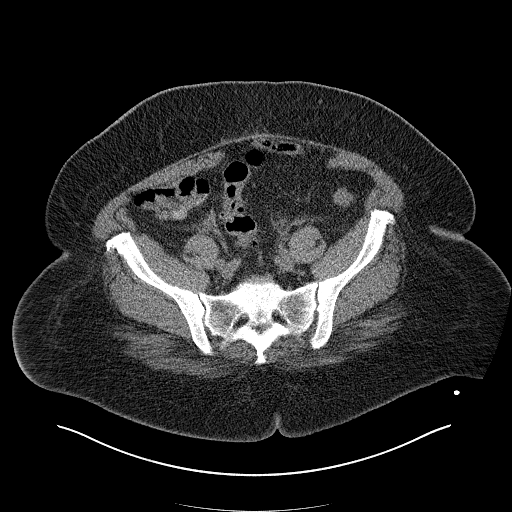
[im 31/56  soft-tissue]
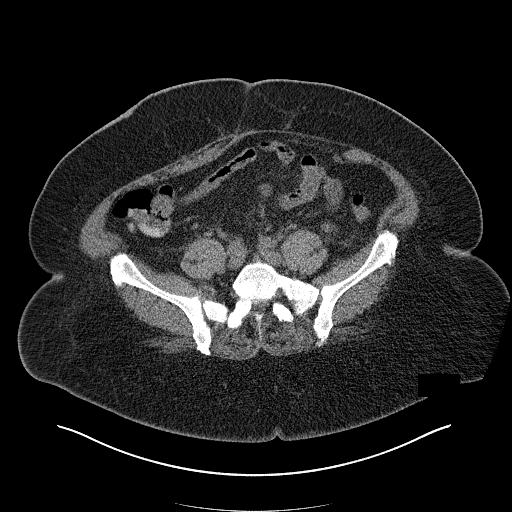
[im 34/56  soft-tissue]
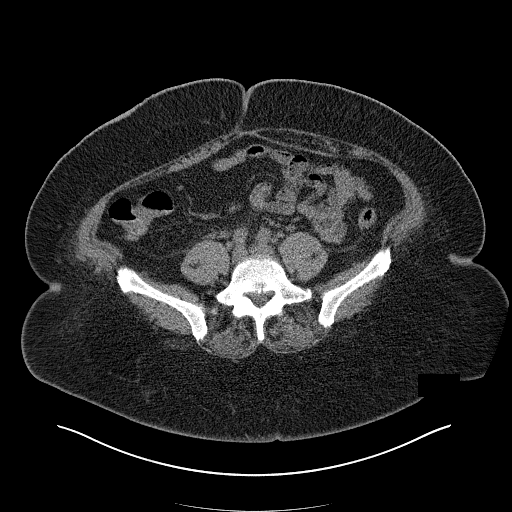
[im 34/56  bone]
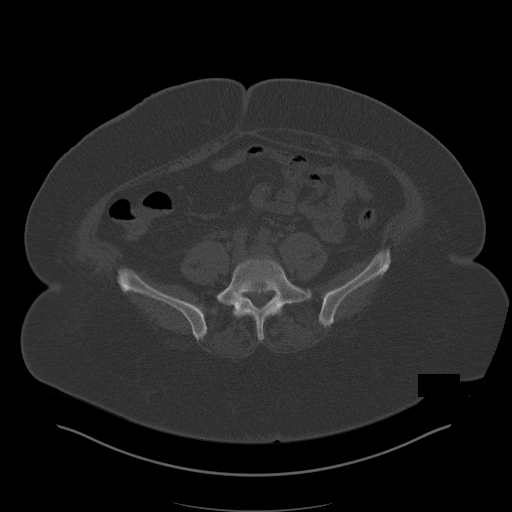
[im 38/56  soft-tissue]
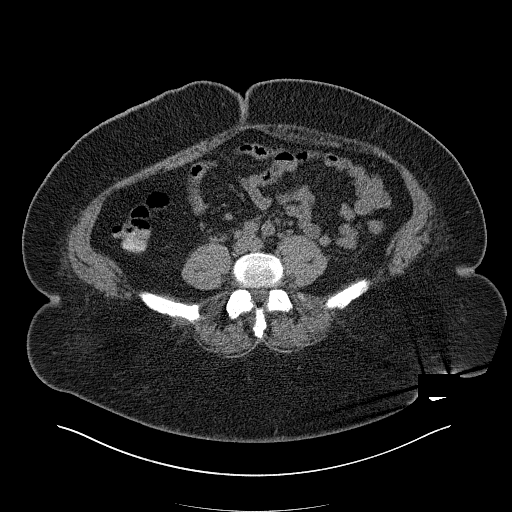
[im 41/56  soft-tissue]
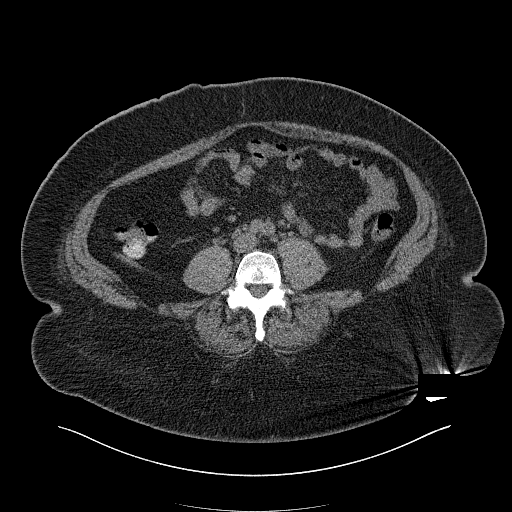
[im 45/56  soft-tissue]
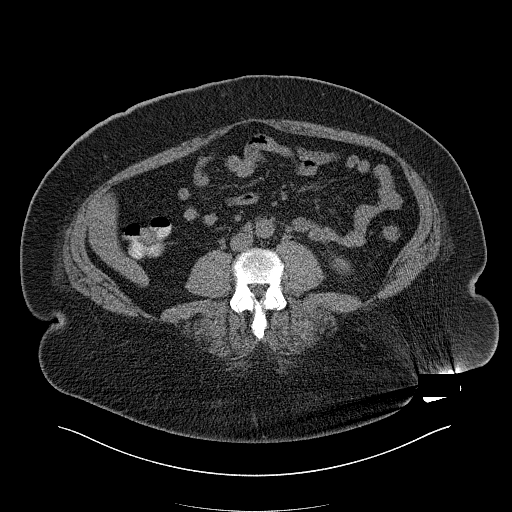
[im 48/56  soft-tissue]
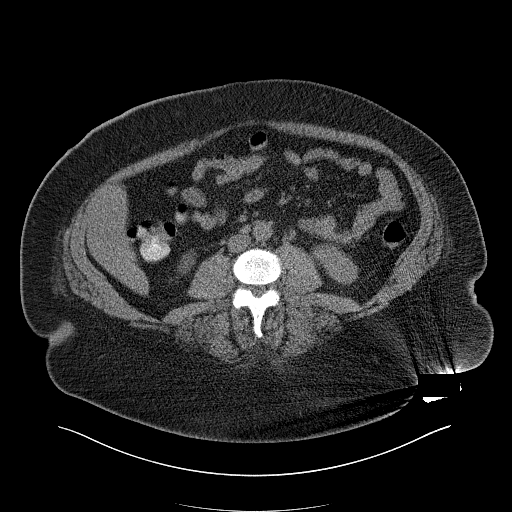
[im 48/56  lung]
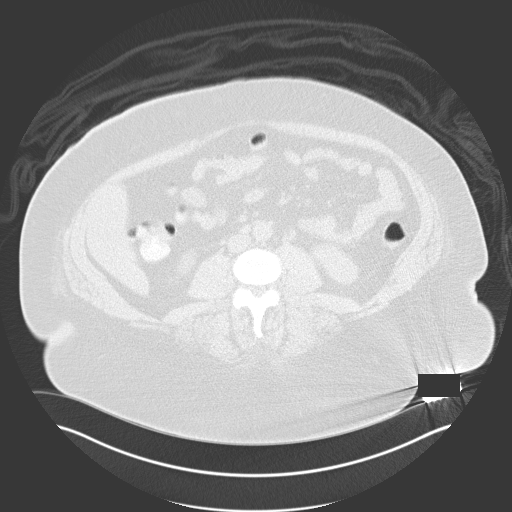
[im 50/56  lung]
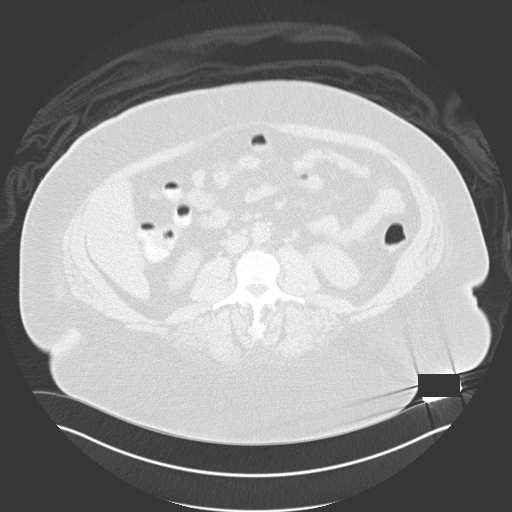
[im 52/56  soft-tissue]
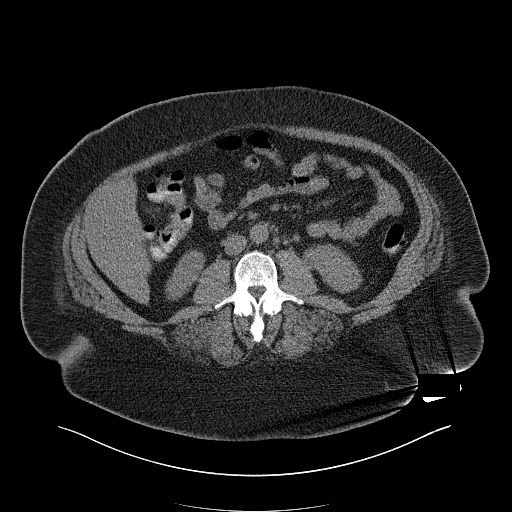
[im 52/56  lung]
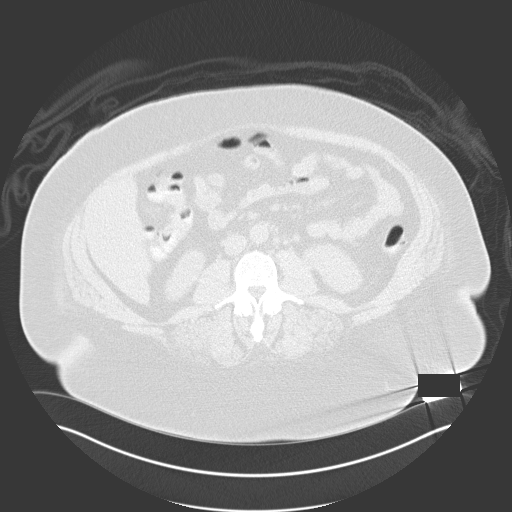
[im 54/56  lung]
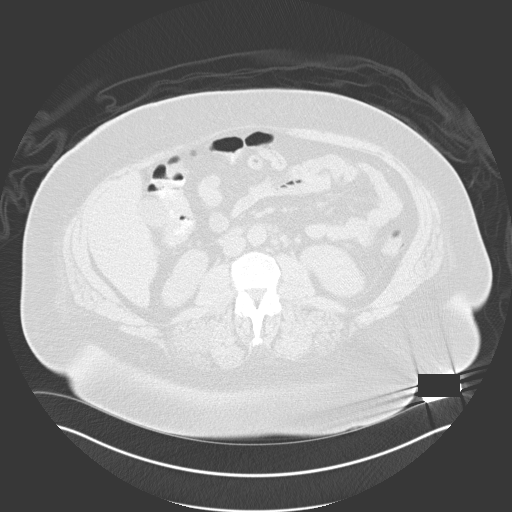

[16 of 32 positions shown; findings below may reference images not displayed]

FINDINGS: Loculations of fluid again noted adjacent to the uterus anteriorly
and to the right of midline. Due to overlying small bowel and colon,
there was no safe percutaneous window to allow a percutaneous
drainage procedure under CT guidance. Follow-up CT of the abdomen
and pelvis is recommended.
IMPRESSION: No safe percutaneous window was available to allow percutaneous
drainage of the pelvic inflammatory process under CT guidance due to
overlying small bowel and colon.
